# Patient Record
Sex: Female | Born: 1985 | Race: White | Hispanic: No | Marital: Single | Smoking: Current every day smoker
Health system: Southern US, Community
[De-identification: ages and names within clinical notes are randomized; demographics above are authoritative.]

## PROBLEM LIST (undated history)

## (undated) DIAGNOSIS — I10 Essential (primary) hypertension: Secondary | ICD-10-CM

## (undated) DIAGNOSIS — J45909 Unspecified asthma, uncomplicated: Secondary | ICD-10-CM

## (undated) DIAGNOSIS — M1990 Unspecified osteoarthritis, unspecified site: Secondary | ICD-10-CM

## (undated) DIAGNOSIS — E119 Type 2 diabetes mellitus without complications: Secondary | ICD-10-CM

## (undated) DIAGNOSIS — M5126 Other intervertebral disc displacement, lumbar region: Secondary | ICD-10-CM

## (undated) HISTORY — PX: BACK SURGERY: SHX140

---
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
## 2006-02-08 ENCOUNTER — Emergency Department: Payer: Self-pay | Admitting: Emergency Medicine

## 2006-02-08 ENCOUNTER — Other Ambulatory Visit: Payer: Self-pay

## 2006-10-03 ENCOUNTER — Emergency Department: Payer: Self-pay | Admitting: Emergency Medicine

## 2006-12-13 ENCOUNTER — Ambulatory Visit: Payer: Self-pay | Admitting: Internal Medicine

## 2007-01-15 ENCOUNTER — Emergency Department: Payer: Self-pay | Admitting: Emergency Medicine

## 2007-01-24 ENCOUNTER — Emergency Department: Payer: Self-pay | Admitting: Emergency Medicine

## 2007-04-28 ENCOUNTER — Emergency Department: Payer: Self-pay | Admitting: Emergency Medicine

## 2007-11-10 ENCOUNTER — Emergency Department: Payer: Self-pay | Admitting: Emergency Medicine

## 2007-11-25 ENCOUNTER — Emergency Department: Payer: Self-pay | Admitting: Emergency Medicine

## 2007-11-25 ENCOUNTER — Other Ambulatory Visit: Payer: Self-pay

## 2009-03-08 ENCOUNTER — Emergency Department: Payer: Self-pay | Admitting: Emergency Medicine

## 2012-10-24 ENCOUNTER — Emergency Department: Payer: Self-pay | Admitting: Emergency Medicine

## 2013-03-21 ENCOUNTER — Inpatient Hospital Stay: Payer: Self-pay | Admitting: Internal Medicine

## 2013-03-21 LAB — BASIC METABOLIC PANEL
Anion Gap: 6 — ABNORMAL LOW (ref 7–16)
BUN: 13 mg/dL (ref 7–18)
Creatinine: 0.78 mg/dL (ref 0.60–1.30)
EGFR (African American): >60
EGFR (Non-African Amer.): >60
Glucose: 145 mg/dL — ABNORMAL HIGH (ref 65–99)
Potassium: 3.1 mmol/L — ABNORMAL LOW (ref 3.5–5.1)
Sodium: 138 mmol/L (ref 136–145)

## 2013-03-21 LAB — CBC WITH DIFFERENTIAL/PLATELET
Eosinophil #: 0.1 10*3/uL (ref 0.0–0.7)
Eosinophil %: 0.9 %
HCT: 38.8 % (ref 35.0–47.0)
Lymphocyte #: 4.2 10*3/uL — ABNORMAL HIGH (ref 1.0–3.6)
MCH: 29.4 pg (ref 26.0–34.0)
MCHC: 33.5 g/dL (ref 32.0–36.0)
MCV: 88 fL (ref 80–100)
Monocyte #: 0.5 x10 3/mm (ref 0.2–0.9)
Monocyte %: 4.4 %
Neutrophil #: 7.1 10*3/uL — ABNORMAL HIGH (ref 1.4–6.5)
Neutrophil %: 59.1 %
WBC: 11.9 10*3/uL — ABNORMAL HIGH (ref 3.6–11.0)

## 2013-03-22 LAB — CBC WITH DIFFERENTIAL/PLATELET
Basophil #: 0 10*3/uL (ref 0.0–0.1)
MCH: 29.3 pg (ref 26.0–34.0)
Monocyte #: 0.1 x10 3/mm — ABNORMAL LOW (ref 0.2–0.9)
Monocyte %: 0.8 %
Neutrophil #: 8.1 10*3/uL — ABNORMAL HIGH (ref 1.4–6.5)
Neutrophil %: 87.2 %
Platelet: 248 10*3/uL (ref 150–440)
RDW: 15 % — ABNORMAL HIGH (ref 11.5–14.5)

## 2013-03-22 LAB — COMPREHENSIVE METABOLIC PANEL
Albumin: 3.3 g/dL — ABNORMAL LOW (ref 3.4–5.0)
BUN: 14 mg/dL (ref 7–18)
Calcium, Total: 9 mg/dL (ref 8.5–10.1)
Chloride: 106 mmol/L (ref 98–107)
Co2: 25 mmol/L (ref 21–32)
Creatinine: 0.72 mg/dL (ref 0.60–1.30)
Glucose: 129 mg/dL — ABNORMAL HIGH (ref 65–99)
Potassium: 3.9 mmol/L (ref 3.5–5.1)
SGOT(AST): 13 U/L — ABNORMAL LOW (ref 15–37)
Total Protein: 7.1 g/dL (ref 6.4–8.2)

## 2013-03-23 LAB — RAPID HIV-1/2 QL/CONFIRM: HIV-1/2,Rapid Ql: NEGATIVE

## 2013-03-23 LAB — URINALYSIS, COMPLETE
Bacteria: NONE SEEN
Bilirubin,UR: NEGATIVE
Ph: 6 (ref 4.5–8.0)
Protein: NEGATIVE
RBC,UR: <1 /HPF (ref 0–5)
Squamous Epithelial: 5
WBC UR: <1 /HPF (ref 0–5)

## 2013-03-23 LAB — GC/CHLAMYDIA PROBE AMP

## 2013-03-24 LAB — EXPECTORATED SPUTUM ASSESSMENT W GRAM STAIN, RFLX TO RESP C

## 2013-03-25 LAB — CBC WITH DIFFERENTIAL/PLATELET
Basophil #: 0.1 10*3/uL (ref 0.0–0.1)
Eosinophil %: 0.1 %
HGB: 13.9 g/dL (ref 12.0–16.0)
Lymphocyte #: 4 10*3/uL — ABNORMAL HIGH (ref 1.0–3.6)
MCHC: 34.1 g/dL (ref 32.0–36.0)
Monocyte %: 6.6 %
Neutrophil #: 10.6 10*3/uL — ABNORMAL HIGH (ref 1.4–6.5)
Neutrophil %: 67.3 %
Platelet: 274 10*3/uL (ref 150–440)
RDW: 14.9 % — ABNORMAL HIGH (ref 11.5–14.5)
WBC: 15.8 10*3/uL — ABNORMAL HIGH (ref 3.6–11.0)

## 2013-03-25 LAB — BASIC METABOLIC PANEL
Anion Gap: 7 (ref 7–16)
BUN: 23 mg/dL — ABNORMAL HIGH (ref 7–18)
Co2: 27 mmol/L (ref 21–32)
Creatinine: 0.72 mg/dL (ref 0.60–1.30)
EGFR (African American): >60
Potassium: 3.7 mmol/L (ref 3.5–5.1)
Sodium: 138 mmol/L (ref 136–145)

## 2013-11-24 DIAGNOSIS — Z9109 Other allergy status, other than to drugs and biological substances: Secondary | ICD-10-CM | POA: Insufficient documentation

## 2013-11-24 DIAGNOSIS — J45909 Unspecified asthma, uncomplicated: Secondary | ICD-10-CM | POA: Insufficient documentation

## 2013-11-24 DIAGNOSIS — G43909 Migraine, unspecified, not intractable, without status migrainosus: Secondary | ICD-10-CM | POA: Insufficient documentation

## 2013-11-24 DIAGNOSIS — J4540 Moderate persistent asthma, uncomplicated: Secondary | ICD-10-CM | POA: Insufficient documentation

## 2013-11-24 DIAGNOSIS — K219 Gastro-esophageal reflux disease without esophagitis: Secondary | ICD-10-CM | POA: Insufficient documentation

## 2014-02-23 ENCOUNTER — Emergency Department: Payer: Self-pay | Admitting: Emergency Medicine

## 2014-03-14 ENCOUNTER — Emergency Department: Payer: Self-pay | Admitting: Emergency Medicine

## 2014-03-15 LAB — CBC WITH DIFFERENTIAL/PLATELET
BASOS PCT: 1.2 %
Basophil #: 0.1 10*3/uL (ref 0.0–0.1)
EOS PCT: 1.7 %
Eosinophil #: 0.2 10*3/uL (ref 0.0–0.7)
HCT: 38.4 % (ref 35.0–47.0)
HGB: 13 g/dL (ref 12.0–16.0)
LYMPHS PCT: 34.4 %
Lymphocyte #: 3.1 10*3/uL (ref 1.0–3.6)
MCH: 30.7 pg (ref 26.0–34.0)
MCHC: 33.9 g/dL (ref 32.0–36.0)
MCV: 91 fL (ref 80–100)
Monocyte #: 0.4 x10 3/mm (ref 0.2–0.9)
Monocyte %: 4.5 %
Neutrophil #: 5.2 10*3/uL (ref 1.4–6.5)
Neutrophil %: 58.2 %
Platelet: 269 10*3/uL (ref 150–440)
RBC: 4.24 10*6/uL (ref 3.80–5.20)
RDW: 15.9 % — ABNORMAL HIGH (ref 11.5–14.5)
WBC: 8.9 10*3/uL (ref 3.6–11.0)

## 2014-03-15 LAB — COMPREHENSIVE METABOLIC PANEL
ANION GAP: 8 (ref 7–16)
AST: 12 U/L — AB (ref 15–37)
Albumin: 3.3 g/dL — ABNORMAL LOW (ref 3.4–5.0)
Alkaline Phosphatase: 73 U/L
BILIRUBIN TOTAL: 0.2 mg/dL (ref 0.2–1.0)
BUN: 11 mg/dL (ref 7–18)
CALCIUM: 8.8 mg/dL (ref 8.5–10.1)
CO2: 23 mmol/L (ref 21–32)
CREATININE: 0.94 mg/dL (ref 0.60–1.30)
Chloride: 108 mmol/L — ABNORMAL HIGH (ref 98–107)
EGFR (Non-African Amer.): >60
Glucose: 110 mg/dL — ABNORMAL HIGH (ref 65–99)
Osmolality: 278 (ref 275–301)
Potassium: 3.5 mmol/L (ref 3.5–5.1)
SGPT (ALT): 16 U/L
Sodium: 139 mmol/L (ref 136–145)
Total Protein: 6.5 g/dL (ref 6.4–8.2)

## 2014-03-15 LAB — URINALYSIS, COMPLETE
BILIRUBIN, UR: NEGATIVE
Blood: NEGATIVE
Glucose,UR: NEGATIVE mg/dL (ref 0–75)
Nitrite: NEGATIVE
Ph: 5 (ref 4.5–8.0)
Protein: 30
Specific Gravity: 1.032 (ref 1.003–1.030)
Squamous Epithelial: 20
WBC UR: 75 /HPF (ref 0–5)

## 2014-03-15 LAB — LIPASE, BLOOD: Lipase: 165 U/L (ref 73–393)

## 2014-03-15 LAB — TROPONIN I

## 2014-05-15 ENCOUNTER — Emergency Department: Payer: Self-pay | Admitting: Emergency Medicine

## 2014-05-26 ENCOUNTER — Emergency Department: Payer: Self-pay | Admitting: Student

## 2014-08-12 ENCOUNTER — Emergency Department: Payer: Self-pay | Admitting: Emergency Medicine

## 2014-10-30 NOTE — H&P (Signed)
PATIENT NAME:  Stacey Pope, Stacey Pope MR#:  [id number] DATE OF BIRTH:  July 25, 1985   PRIMARY CARE PHYSICIAN: Dr. Judithann Sheen.   HISTORY OF PRESENT ILLNESS: The patient is a 29 year old Caucasian female with history of asthma, history of tobacco abuse, ongoing, who presents to the hospital with complaints of cough, shortness of breath, as well as wheezing. According to the patient, she was doing well up until approximately Sunday, which is 6 days ago, when she started having dry cough as well as wheezing as well as fevers. Her fever was as high as 101 last night. She was seen at [facility] walk-in  clinic where she had x-ray done which did not show any pneumonia; however, the patient was given Solu-Medrol as well as Xopenex inhalation. She was prescribed Levaquin as well as prednisone. She returned back to [facility]. She was again given DuoNebs; however, since she did not improve, she was sent to the Emergency Room today.   After 3 DuoNebs here in the Emergency Room, she is still wheezing and her peak flow as high as 250. Her O2 saturations are 96% on room air. Hospitalist services were contacted for admission.   PAST MEDICAL HISTORY: Significant for history of asthma. The patient's medical history is also significant for environmental allergies, depression with questionable bipolar symptoms, also gastritis, gastroesophageal reflux, obesity, apnea, migraine headaches, with a negative CT scan in March 2008.   PAST SURGICAL HISTORY: None.   SOCIAL HISTORY: The patient is single and has no children. She smokes approximately 1 cigar a day and she denies alcohol. She does not work.   FAMILY HISTORY: Significant for the patient's father who died of liver cirrhosis. The patient's mother had spinal cord injury. She is on disability and she also has diabetes.   ALLERGIES: AUGMENTIN AS WELL AS ZITHROMAX.   REVIEW OF SYSTEMS: Positive for cough with wheezes and shortness of breath, a dry cough, also fevers and fatigue and  weakness. She admits of losing weight approximately, 65 pounds, in the past year due to walking. She admits of having some chest pains as well as with cough. She has a few episodes of green-looking sputum, and she intermittently feels anxious. She also has nausea and intermittent constipation. Otherwise, denies any other problems with vision like double vision, glaucoma or, cataracts.  ENT:  Denies any tinnitus, allergies, epistaxis, sinus tenderness, difficulty swallowing. Admits of having some sinus congestion.  RESPIRATORY: Denies any hemoptysis. Admits of asthma, admits of painful respirations especially with cough, and she admits of having some black dots in front of her eyes whenever she coughs numerous times but denies any presyncopal episodes. No orthopnea, edema. Admits to arrhythmias as well as palpitations. No syncope.  GASTROINTESTINAL: Denies any vomiting, diarrhea, rectal bleeding, change in bowel habits.  GENITOURINARY: Denies dysuria, hematuria, frequency, incontinence.  ENDOCRINOLOGY: Denies any polydipsia, nocturia, thyroid problems, heat or cold intolerance or thirst.  HEMATOLOGIC: Denies anemia, easy bruising, bleeding or swollen glands.  SKIN: Denies any acne, rashes or change in moles.  MUSCULOSKELETAL: Denies arthritis, cramps, (Dictation Anomaly) .  No numbness, epilepsy or tremor.  PSYCHIATRIC: Denies anxiety, insomnia, depression.   PHYSICAL EXAMINATION:  VITAL SIGNS: On arrival to the hospital, temperature 98.3, pulse 82, respirations 18, blood pressure 132/82. Saturation was 96% on room air.  GENERAL: A well-nourished Caucasian female in no significant distress, comfortable on the stretcher. She is somewhat dusky, pale looking, but otherwise overall comfortable.  HEENT: Pupils are equal, reactive to light. Extraocular movements are intact. No icterus  or conjunctivitis. Has normal hearing. No pharyngeal erythema. Mucosa is moist.  NECK: No masses. Supple. Thyroid is not  enlarged. No adenopathy. No JVD or carotid bruits bilaterally. Full range of motion.  LUNGS: Rhonchi as well as wheezing. Somewhat diminished breath sounds and labored inspirations intermittently whenever she coughs. At that time, when she coughs, she will have increased effort to breathe. In mild respiratory distress. No dullness to percussion.  CARDIOVASCULAR: S1, S2 appreciated. No murmurs, rubs, or gallops. Regular rate and rhythm. PMI not enlarged.  Chest is nontender to palpation. Pedal pulses 1+. No lower extremity edema or calf tenderness or cyanosis was noted.  ABDOMEN: Soft, nontender. Bowel sounds are present. No hepatosplenomegaly or masses were noted.  RECTAL: Deferred.  MUSCLE STRENGTH: Able to move all extremities. No cyanosis, degenerative joint disease or kyphosis. Gait is not tested.  SKIN: Without any rashes, lesions, erythema, nodularity or induration. It was warm and dry to palpation. She has  onychomycosis in her toes.  LYMPHATIC: No adenopathy in the cervical region.  NEUROLOGICAL: Cranial nerves grossly intact. Sensory is intact. No dysarthria or aphasia. The patient is alert and oriented to time, person and place, cooperative. Memory is good.  PSYCHIATRIC: No significant confusion, agitation or depression noted.   DIAGNOSTIC STUDIES: [facility]: No obvious pneumonia.   I cannot see this outpatient x-ray at this point and confirm these findings which were reiterated  to me from by Emergency Room physician.   None of the labs are done at this point.   ASSESSMENT AND PLAN:  1.  Asthma exacerbation. Admitted to medical floor. Start her on steroids IV. Also initiate the patient on albuterol, ipratropium as well as (Dictation Anomaly)inhaled steroids twice a day and follow clinically. We will also initiate her on antibiotic therapy with Levaquin.  2.  Acute bronchitis. Get sputum cultures and continue Levaquin.  3.  Tobacco abuse. Discussed cessation for  approximately 4 minutes and nicotine replacement therapy will be initiated.  4.  History of headaches. Tylenol as needed.   TIME SPENT: 50 minutes on the patient and care.   ____________________________ Katharina Caperima Argie Lober, MD rv:np D: 03/21/2013 20:27:00 ET T: 03/21/2013 22:51:48 ET JOB#: [id number]  cc: Duane LopeJeffrey D. Judithann SheenSparks, MD Katharina Caperima Oluwatobiloba Martin, MD, <Dictator> Rondarius Kadrmas MD ELECTRONICALLY SIGNED 04/29/2013 22:29

## 2014-10-30 NOTE — Discharge Summary (Signed)
PATIENT NAME:  Stacey Pope, Stacey Pope MR#:  [id number] DATE OF BIRTH:  [birth year]  DATE OF ADMISSION:  03/21/2013 DATE OF DISCHARGE:  03/25/2013  REASON FOR ADMISSION: Shortness of breath.   HISTORY OF PRESENT ILLNESS: Please see the dictated HPI done by Dr. Winona LegatoVaickute on 03/21/2013.   PAST MEDICAL HISTORY:  1.  COPD/asthma.  2.  Tobacco abuse.  3.  Environmental allergies. 4.  Anxiety/depression.  5.  Gastritis.  6.  GE reflux disease.  7.  Migraine headaches.  8.  Obesity.   MEDICATIONS ON ADMISSION: Please see admission note.   ALLERGIES: AUGMENTIN AND ZITHROMAX.   SOCIAL HISTORY, FAMILY HISTORY AND REVIEW OF SYSTEMS: As per admission note.   PHYSICAL EXAM:  GENERAL: The patient was in no acute distress.  VITAL SIGNS: Stable and she was afebrile.  HEENT: Unremarkable.  NECK: Supple without JVD.  LUNGS: Revealed scattered wheezes and rhonchi.  CARDIAC: Regular rate and rhythm. Normal S1, S2.  ABDOMEN: Soft and nontender.  EXTREMITIES: Without edema.  NEUROLOGIC: Grossly nonfocal.   HOSPITAL COURSE: The patient was admitted with asthma exacerbation and acute bronchitis. She was started on IV antibiotics with IV steroids with DuoNeb SVNs. The patient improved slowly. Followup chest x-ray was negative for pneumonia. She was weaned to oral steroids and oral antibiotics with continued improvement. She was weaned off oxygen. By 03/25/2013, the patient was stable and ready for discharge.   DISCHARGE DIAGNOSES:  1.  Acute bronchitis.  2.  Asthma exacerbation.  3.  Anxiety/depression.  4.  Tobacco abuse.   DISCHARGE MEDICATIONS:  1.  Albuterol 2 puffs q.i.d. p.r.n. shortness of breath.  2.  Prednisone taper as directed.  3.  Lexapro 10 mg p.o. daily.  4.  Combivent 1 puff q.i.d.  5.  Levaquin 500 mg p.o. daily x 10 days.  6.  Nicoderm 14 mg topically daily.  7.  Robitussin-AC 10 mL p.o. q.4 hours p.r.n. cough.   FOLLOWUP PLANS AND APPOINTMENTS: The patient was discharged on a  regular diet. She will follow up with me in the office in 1 to 2 weeks, sooner if needed.  ____________________________ Duane LopeJeffrey D. Judithann SheenSparks, MD jds:aw D: 04/03/2013 11:54:29 ET T: 04/03/2013 12:39:43 ET JOB#: [id number]  cc: Duane LopeJeffrey D. Judithann SheenSparks, MD, <Dictator> JEFFREY Rodena Medin SPARKS MD ELECTRONICALLY SIGNED 04/03/2013 13:11

## 2015-05-18 ENCOUNTER — Emergency Department: Payer: Self-pay

## 2015-05-18 ENCOUNTER — Encounter: Payer: Self-pay | Admitting: Emergency Medicine

## 2015-05-18 ENCOUNTER — Emergency Department
Admission: EM | Admit: 2015-05-18 | Discharge: 2015-05-18 | Disposition: A | Discharge status: Home / Self Care | Payer: Self-pay | Attending: Emergency Medicine | Admitting: Emergency Medicine

## 2015-05-18 DIAGNOSIS — O99331 Smoking (tobacco) complicating pregnancy, first trimester: Secondary | ICD-10-CM | POA: Insufficient documentation

## 2015-05-18 DIAGNOSIS — R1031 Right lower quadrant pain: Secondary | ICD-10-CM

## 2015-05-18 DIAGNOSIS — R1032 Left lower quadrant pain: Secondary | ICD-10-CM

## 2015-05-18 DIAGNOSIS — Z3A00 Weeks of gestation of pregnancy not specified: Secondary | ICD-10-CM | POA: Insufficient documentation

## 2015-05-18 DIAGNOSIS — O039 Complete or unspecified spontaneous abortion without complication: Secondary | ICD-10-CM | POA: Insufficient documentation

## 2015-05-18 DIAGNOSIS — O209 Hemorrhage in early pregnancy, unspecified: Secondary | ICD-10-CM

## 2015-05-18 DIAGNOSIS — N939 Abnormal uterine and vaginal bleeding, unspecified: Secondary | ICD-10-CM

## 2015-05-18 DIAGNOSIS — F17210 Nicotine dependence, cigarettes, uncomplicated: Secondary | ICD-10-CM | POA: Insufficient documentation

## 2015-05-18 HISTORY — DX: Unspecified asthma, uncomplicated: J45.909

## 2015-05-18 LAB — URINALYSIS COMPLETE WITH MICROSCOPIC (ARMC ONLY)
BACTERIA UA: NONE SEEN
Bilirubin Urine: NEGATIVE
Glucose, UA: NEGATIVE mg/dL
Ketones, ur: NEGATIVE mg/dL
NITRITE: NEGATIVE
PH: 6 (ref 5.0–8.0)
PROTEIN: NEGATIVE mg/dL
SPECIFIC GRAVITY, URINE: 1.019 (ref 1.005–1.030)

## 2015-05-18 LAB — CBC
HEMATOCRIT: 41.9 % (ref 35.0–47.0)
HEMOGLOBIN: 14 g/dL (ref 12.0–16.0)
MCH: 30.4 pg (ref 26.0–34.0)
MCHC: 33.3 g/dL (ref 32.0–36.0)
MCV: 91.3 fL (ref 80.0–100.0)
Platelets: 237 10*3/uL (ref 150–440)
RBC: 4.59 MIL/uL (ref 3.80–5.20)
RDW: 13.9 % (ref 11.5–14.5)
WBC: 10.4 10*3/uL (ref 3.6–11.0)

## 2015-05-18 LAB — COMPREHENSIVE METABOLIC PANEL
ALBUMIN: 4.1 g/dL (ref 3.5–5.0)
ALT: 12 U/L — ABNORMAL LOW (ref 14–54)
AST: 14 U/L — ABNORMAL LOW (ref 15–41)
Alkaline Phosphatase: 68 U/L (ref 38–126)
Anion gap: 5 (ref 5–15)
BUN: 12 mg/dL (ref 6–20)
CO2: 25 mmol/L (ref 22–32)
Calcium: 9.1 mg/dL (ref 8.9–10.3)
Chloride: 110 mmol/L (ref 101–111)
Creatinine, Ser: 0.65 mg/dL (ref 0.44–1.00)
GFR calc Af Amer: >60 mL/min (ref >60)
GFR calc non Af Amer: >60 mL/min (ref >60)
GLUCOSE: 103 mg/dL — AB (ref 65–99)
POTASSIUM: 3.7 mmol/L (ref 3.5–5.1)
SODIUM: 140 mmol/L (ref 135–145)
Total Bilirubin: 0.6 mg/dL (ref 0.3–1.2)
Total Protein: 7.2 g/dL (ref 6.5–8.1)

## 2015-05-18 LAB — LIPASE, BLOOD: LIPASE: 25 U/L (ref 11–51)

## 2015-05-18 LAB — CHLAMYDIA/NGC RT PCR (ARMC ONLY)
Chlamydia Tr: NOT DETECTED
N GONORRHOEAE: NOT DETECTED

## 2015-05-18 LAB — WET PREP, GENITAL
CLUE CELLS WET PREP: NONE SEEN
Trich, Wet Prep: NONE SEEN
WBC WET PREP: NONE SEEN
Yeast Wet Prep HPF POC: NONE SEEN

## 2015-05-18 LAB — ANTIBODY SCREEN: ANTIBODY SCREEN: NEGATIVE

## 2015-05-18 LAB — HCG, QUANTITATIVE, PREGNANCY: HCG, BETA CHAIN, QUANT, S: 4 m[IU]/mL (ref <5)

## 2015-05-18 LAB — ABO/RH: ABO/RH(D): A NEG

## 2015-05-18 MED ORDER — IBUPROFEN 800 MG PO TABS: 800.0000 mg | ORAL_TABLET | Freq: Three times a day (TID) | ORAL | Status: DC | PRN

## 2015-05-18 MED ORDER — OXYCODONE-ACETAMINOPHEN 5-325 MG PO TABS: 1.0000 | ORAL_TABLET | ORAL | Status: AC | PRN

## 2015-05-18 MED ORDER — RHO D IMMUNE GLOBULIN 1500 UNIT/2ML IJ SOSY
300.0000 ug | PREFILLED_SYRINGE | Freq: Once | INTRAMUSCULAR | Status: AC
  Administered 2015-05-18: 300 ug via INTRAMUSCULAR
  Filled 2015-05-18: qty 2

## 2015-05-18 MED ORDER — KETOROLAC TROMETHAMINE 30 MG/ML IJ SOLN
30.0000 mg | Freq: Once | INTRAMUSCULAR | Status: AC
  Administered 2015-05-18: 30 mg via INTRAVENOUS
  Filled 2015-05-18: qty 1

## 2015-05-18 MED ORDER — ONDANSETRON HCL 4 MG/2ML IJ SOLN
4.0000 mg | Freq: Once | INTRAMUSCULAR | Status: AC
  Administered 2015-05-18: 4 mg via INTRAVENOUS
  Filled 2015-05-18: qty 2

## 2015-05-18 MED ORDER — ACETAMINOPHEN 500 MG PO TABS
1000.0000 mg | ORAL_TABLET | Freq: Once | ORAL | Status: AC
  Administered 2015-05-18: 1000 mg via ORAL
  Filled 2015-05-18: qty 2

## 2015-05-18 MED ORDER — SODIUM CHLORIDE 0.9 % IV BOLUS (SEPSIS)
1000.0000 mL | Freq: Once | INTRAVENOUS | Status: AC
  Administered 2015-05-18: 1000 mL via INTRAVENOUS

## 2015-05-18 NOTE — ED Notes (Signed)
Pt to ed with c/o vaginal bleeding today with abd pain and cramping.  Pt reports she had positive pregnancy test on Friday.

## 2015-05-18 NOTE — ED Provider Notes (Addendum)
[facility] Emergency Department Provider Note  ____________________________________________  Time seen: Approximately 3:32 PM  I have reviewed the triage vital signs and the nursing notes.   HISTORY  Chief Complaint Abdominal Pain and Vaginal Bleeding    HPI Stacey Pope is a 29 y.o. female G1 P0 with LMP 9/28 4/16 presenting with acute onset of significant vaginal bleeding and lower abdominal cramping. Patient states that she has been having nausea and vomiting for several weeks and had a positive pregnancy test at home 4 days ago. This morning she woke up with pelvic cramping and bleeding heavier than a period. She states that she passed one large clot. She denies any shortness of breath or lightheadedness. She denies any fever. Recently, she states that her vaginal discharge has been malodorous. She denies any urinary symptoms.   Past Medical History  Diagnosis Date  . Asthma     There are no active problems to display for this patient.   History reviewed. No pertinent past surgical history.  Current Outpatient Rx  Name  Route  Sig  Dispense  Refill  . ibuprofen (ADVIL,MOTRIN) 800 MG tablet   Oral   Take 1 tablet (800 mg total) by mouth every 8 (eight) hours as needed for moderate pain (with food).   20 tablet   0   . oxyCODONE-acetaminophen (ROXICET) 5-325 MG tablet   Oral   Take 1 tablet by mouth every 4 (four) hours as needed for severe pain.   6 tablet   0     Allergies Augmentin  History reviewed. No pertinent family history.  Social History Social History  Substance Use Topics  . Smoking status: Current Every Day Smoker  . Smokeless tobacco: None  . Alcohol Use: No    Review of Systems Constitutional: No fever/chills. No lightheadedness or syncope. Eyes: No visual changes. ENT: No sore throat. Cardiovascular: Denies chest pain, palpitations. Respiratory: Denies shortness of breath.  No cough. Gastrointestinal:  Positive lower abdominal pain.  Positive nausea, positive vomiting.  No diarrhea.  No constipation. Genitourinary: Negative for dysuria. Musculoskeletal: Negative for back pain. Skin: Negative for rash. Neurological: Negative for headaches, focal weakness or numbness.  10-point ROS otherwise negative.  ____________________________________________   PHYSICAL EXAM:  VITAL SIGNS: ED Triage Vitals  Enc Vitals Group     BP 05/18/15 1422 152/98 mmHg     Pulse Rate 05/18/15 1422 85     Resp --      Temp 05/18/15 1422 98.6 F (37 C)     Temp Source 05/18/15 1422 Oral     SpO2 05/18/15 1422 100 %     Weight 05/18/15 1422 210 lb (95.255 kg)     Height 05/18/15 1422  (1.676 m)     Head Cir --      Peak Flow --      Pain Score 05/18/15 1408 8     Pain Loc --      Pain Edu? --      Excl. in GC? --     Constitutional: Alert and oriented. Well appearing and in no acute distress. Answer question appropriately. Tearful Eyes: Conjunctivae are normal.  EOMI. Head: Atraumatic. Nose: No congestion/rhinnorhea. Mouth/Throat: Mucous membranes are moist.  Neck: No stridor.  Supple.   Cardiovascular: Normal rate, regular rhythm. No murmurs, rubs or gallops.  Respiratory: Normal respiratory effort.  No retractions. Lungs CTAB.  No wheezes, rales or ronchi. Gastrointestinal: Obese, soft and nondistended. Minimally tender in the suprapubic region.  No rebound or guarding. No peritoneal signs. Genitourinary: normal appearing external genitalia w/o lesions.  Speculum exam reveals normal appearing cervix w/ minimal blood in the vagina but no significant active extrusion.  No dishcarge. Bimanual exam reveals closed cervix, no CMT, no adnexal masses, diffuse pelvic ttp w/o focality. Musculoskeletal: No LE edema.  Neurologic:  Normal speech and language. No gross focal neurologic deficits are appreciated.  Skin:  Skin is warm, dry and intact. No rash noted. Psychiatric: Mood and affect are normal.  Speech and behavior are normal.  Normal judgement.  ____________________________________________   LABS (all labs ordered are listed, but only abnormal results are displayed)  Labs Reviewed  COMPREHENSIVE METABOLIC PANEL - Abnormal; Notable for the following:    Glucose, Bld 103 (*)    AST 14 (*)    ALT 12 (*)    All other components within normal limits  URINALYSIS COMPLETEWITH MICROSCOPIC ([facility] ONLY) - Abnormal; Notable for the following:    Color, Urine YELLOW (*)    APPearance HAZY (*)    Hgb urine dipstick 3+ (*)    Leukocytes, UA 2+ (*)    Squamous Epithelial / LPF 0-5 (*)    All other components within normal limits  WET PREP, GENITAL  CHLAMYDIA/NGC RT PCR ([facility] ONLY)  LIPASE, BLOOD  CBC  HCG, QUANTITATIVE, PREGNANCY  ABO/RH   ____________________________________________  EKG  Not indicated ____________________________________________  RADIOLOGY  US Transvaginal Non-ob  05/18/2015  CLINICAL DATA:  Vaginal bleeding since this morning, beta HCG equals 4 EXAM: TRANSABDOMINAL AND TRANSVAGINAL ULTRASOUND OF PELVIS TECHNIQUE: Both transabdominal and transvaginal ultrasound examinations of the pelvis were performed. Transabdominal technique was performed for global imaging of the pelvis including uterus, ovaries, adnexal regions, and pelvic cul-de-sac. It was necessary to proceed with endovaginal exam following the transabdominal exam to visualize the endometrium and ovaries. COMPARISON:  None FINDINGS: Uterus Measurements: 6.8 x 4.3 x 4.1 cm. No fibroids or other mass visualized. Endometrium Thickness: 5 mm. Mildly hypervascular. No focal abnormality visualized. Right ovary Measurements: 37 x 12 x 21 mm. Normal appearance/no adnexal mass. Left ovary Measurements: 44 x 25 x 34 mm. Normal appearance/no adnexal mass. Other findings Trace free fluid in the pelvis within physiologic limits of normal IMPRESSION: No evidence of intrauterine gestation. Endometrium is not  thickened but is mildly hypervascular, significance uncertain. No focal anatomic abnormality is identified. The patient indicates recent positive pregnancy test and differential diagnostic possibilities include gestation that is too early to image, missed spontaneous abortion, or nonvisualized ectopic pregnancy of which there is no other specific evidence. Clinical and imaging follow-up recommended as necessary. Electronically Signed   By: Esperanza Heir M.D.   On: 05/18/2015 17:57   US Pelvis Complete  05/18/2015  CLINICAL DATA:  Vaginal bleeding since this morning, beta HCG equals 4 EXAM: TRANSABDOMINAL AND TRANSVAGINAL ULTRASOUND OF PELVIS TECHNIQUE: Both transabdominal and transvaginal ultrasound examinations of the pelvis were performed. Transabdominal technique was performed for global imaging of the pelvis including uterus, ovaries, adnexal regions, and pelvic cul-de-sac. It was necessary to proceed with endovaginal exam following the transabdominal exam to visualize the endometrium and ovaries. COMPARISON:  None FINDINGS: Uterus Measurements: 6.8 x 4.3 x 4.1 cm. No fibroids or other mass visualized. Endometrium Thickness: 5 mm. Mildly hypervascular. No focal abnormality visualized. Right ovary Measurements: 37 x 12 x 21 mm. Normal appearance/no adnexal mass. Left ovary Measurements: 44 x 25 x 34 mm. Normal appearance/no adnexal mass. Other findings Trace free fluid in the pelvis within physiologic  limits of normal IMPRESSION: No evidence of intrauterine gestation. Endometrium is not thickened but is mildly hypervascular, significance uncertain. No focal anatomic abnormality is identified. The patient indicates recent positive pregnancy test and differential diagnostic possibilities include gestation that is too early to image, missed spontaneous abortion, or nonvisualized ectopic pregnancy of which there is no other specific evidence. Clinical and imaging follow-up recommended as necessary.  Electronically Signed   By: Esperanza Heiraymond  Rubner M.D.   On: 05/18/2015 17:57    ____________________________________________   PROCEDURES  Procedure(s) performed: None  Critical Care performed: No ____________________________________________   INITIAL IMPRESSION / ASSESSMENT AND PLAN / ED COURSE  Pertinent labs & imaging results that were available during my care of the patient were reviewed by me and considered in my medical decision making (see chart for details).  29 y.o. G1 P0 with a positive pregnancy test at home presenting with acute onset of vaginal bleeding and suprapubic cramping. The patient has been treated for gonorrhea and chlamydia in the past, which is a risk factor for possible ectopic. I would also consider for shortness of bleeding and threatened AB. The patient is not having symptoms that would be concerning for significant anemia or hemorrhage. I will do a pelvic exam and send pelvic cultures. We'll get a urine test. If the pregnancy test is positive here we will also get a ultrasound.  ----------------------------------------- 5:14 PM on 05/18/2015 -----------------------------------------  The patient hCG is 4. Given her clinical history, the most likely explanation is that she was pregnant and is now having a miscarriage. However I will continue to send her for ultrasound to make sure she does not have any evidence of ectopic pregnancy. Her blood type is come back and is Rh- so I will treat her with program. Her wet prep is back and does not show any evidence of infection.  ----------------------------------------- 6:01 PM on 05/18/2015 -----------------------------------------  The patient's ultrasound shows no intrauterine pregnancy. She does not have any other acute findings. I will talk to the unassigned OB physician on call for close follow-up. At this time, the patient is hemodynamically stable and ready for  discharge.  ----------------------------------------- 6:44 PM on 05/18/2015 -----------------------------------------  I have talked to Dr. Vergie LivingPickens, the physician who is on-call for unassigned OB. He feels that this clinical picture is most consistent with a completed abortion. He recommends outpatient follow-up as needed. I will plan to discharge the patient and have given her return precautions as well as follow-up instructions. ____________________________________________  FINAL CLINICAL IMPRESSION(S) / ED DIAGNOSES  Final diagnoses:  Vaginal bleeding  Vaginal bleeding in pregnancy, first trimester  Threatened abortion in early pregnancy  Bilateral lower abdominal cramping      NEW MEDICATIONS STARTED DURING THIS VISIT:  New Prescriptions   IBUPROFEN (ADVIL,MOTRIN) 800 MG TABLET    Take 1 tablet (800 mg total) by mouth every 8 (eight) hours as needed for moderate pain (with food).   OXYCODONE-ACETAMINOPHEN (ROXICET) 5-325 MG TABLET    Take 1 tablet by mouth every 4 (four) hours as needed for severe pain.     Rockne MenghiniAnne-Caroline Adali Pennings, MD 05/18/15 1804  Rockne MenghiniAnne-Caroline Imad Shostak, MD 05/18/15 [id number]

## 2015-05-18 NOTE — Discharge Instructions (Signed)
Please return to the emergency department if he developed increased bleeding (soaking through more than one pad every 2 hours), lightheadedness, shortness of breath, fever, severe pain, or any other symptoms concerning to you.

## 2015-05-19 LAB — RHOGAM INJECTION: Unit division: 0

## 2015-05-25 ENCOUNTER — Encounter: Payer: Self-pay | Admitting: *Deleted

## 2015-05-25 ENCOUNTER — Emergency Department
Admission: EM | Admit: 2015-05-25 | Discharge: 2015-05-25 | Disposition: A | Discharge status: Home / Self Care | Payer: Self-pay | Attending: Emergency Medicine | Admitting: Emergency Medicine

## 2015-05-25 ENCOUNTER — Emergency Department: Payer: Self-pay

## 2015-05-25 DIAGNOSIS — Z3202 Encounter for pregnancy test, result negative: Secondary | ICD-10-CM | POA: Insufficient documentation

## 2015-05-25 DIAGNOSIS — R1030 Lower abdominal pain, unspecified: Secondary | ICD-10-CM | POA: Insufficient documentation

## 2015-05-25 DIAGNOSIS — F17200 Nicotine dependence, unspecified, uncomplicated: Secondary | ICD-10-CM | POA: Insufficient documentation

## 2015-05-25 DIAGNOSIS — N760 Acute vaginitis: Secondary | ICD-10-CM | POA: Insufficient documentation

## 2015-05-25 DIAGNOSIS — B9689 Other specified bacterial agents as the cause of diseases classified elsewhere: Secondary | ICD-10-CM

## 2015-05-25 LAB — URINALYSIS COMPLETE WITH MICROSCOPIC (ARMC ONLY)
BILIRUBIN URINE: NEGATIVE
Bacteria, UA: NONE SEEN
Glucose, UA: NEGATIVE mg/dL
HGB URINE DIPSTICK: NEGATIVE
KETONES UR: NEGATIVE mg/dL
Nitrite: NEGATIVE
PH: 6 (ref 5.0–8.0)
Protein, ur: NEGATIVE mg/dL
Specific Gravity, Urine: 1.016 (ref 1.005–1.030)

## 2015-05-25 LAB — COMPREHENSIVE METABOLIC PANEL
ALT: 15 U/L (ref 14–54)
AST: 15 U/L (ref 15–41)
Albumin: 3.7 g/dL (ref 3.5–5.0)
Alkaline Phosphatase: 61 U/L (ref 38–126)
Anion gap: 7 (ref 5–15)
BUN: 13 mg/dL (ref 6–20)
CHLORIDE: 107 mmol/L (ref 101–111)
CO2: 26 mmol/L (ref 22–32)
CREATININE: 0.48 mg/dL (ref 0.44–1.00)
Calcium: 9.3 mg/dL (ref 8.9–10.3)
GFR calc non Af Amer: >60 mL/min (ref >60)
Glucose, Bld: 95 mg/dL (ref 65–99)
POTASSIUM: 3.9 mmol/L (ref 3.5–5.1)
SODIUM: 140 mmol/L (ref 135–145)
Total Bilirubin: 0.5 mg/dL (ref 0.3–1.2)
Total Protein: 6.6 g/dL (ref 6.5–8.1)

## 2015-05-25 LAB — WET PREP, GENITAL
CLUE CELLS WET PREP: POSITIVE — AB
SPERM: NONE SEEN
TRICH WET PREP: NONE SEEN
YEAST WET PREP: NONE SEEN

## 2015-05-25 LAB — CBC WITH DIFFERENTIAL/PLATELET
Basophils Absolute: 0.1 10*3/uL (ref 0–0.1)
Basophils Relative: 1 %
EOS ABS: 0.1 10*3/uL (ref 0–0.7)
Eosinophils Relative: 1 %
HEMATOCRIT: 40.3 % (ref 35.0–47.0)
Hemoglobin: 13.3 g/dL (ref 12.0–16.0)
LYMPHS ABS: 3.1 10*3/uL (ref 1.0–3.6)
LYMPHS PCT: 27 %
MCH: 30.1 pg (ref 26.0–34.0)
MCHC: 33 g/dL (ref 32.0–36.0)
MCV: 91.3 fL (ref 80.0–100.0)
MONOS PCT: 5 %
Monocytes Absolute: 0.6 10*3/uL (ref 0.2–0.9)
NEUTROS ABS: 7.5 10*3/uL — AB (ref 1.4–6.5)
NEUTROS PCT: 66 %
Platelets: 271 10*3/uL (ref 150–440)
RBC: 4.42 MIL/uL (ref 3.80–5.20)
RDW: 13.7 % (ref 11.5–14.5)
WBC: 11.3 10*3/uL — ABNORMAL HIGH (ref 3.6–11.0)

## 2015-05-25 LAB — CHLAMYDIA/NGC RT PCR (ARMC ONLY)
Chlamydia Tr: NOT DETECTED
N gonorrhoeae: NOT DETECTED

## 2015-05-25 LAB — POCT PREGNANCY, URINE: Preg Test, Ur: NEGATIVE

## 2015-05-25 MED ORDER — IOHEXOL 240 MG/ML SOLN
25.0000 mL | Freq: Once | INTRAMUSCULAR | Status: AC | PRN
  Administered 2015-05-25: 25 mL via ORAL
  Filled 2015-05-25: qty 25

## 2015-05-25 MED ORDER — IOHEXOL 350 MG/ML SOLN
100.0000 mL | Freq: Once | INTRAVENOUS | Status: AC | PRN
  Administered 2015-05-25: 100 mL via INTRAVENOUS
  Filled 2015-05-25: qty 100

## 2015-05-25 MED ORDER — METRONIDAZOLE 500 MG PO TABS: 500.0000 mg | ORAL_TABLET | Freq: Two times a day (BID) | ORAL | Status: DC

## 2015-05-25 NOTE — Discharge Instructions (Signed)
No certain cause was found for your lower abdominal pain, however your exam and evaluation are reassuring. I suspect you may be having cramping left over from the miscarriage, or you may be having intestinal cramping due to irritable bowel. He recommended follow up with her OB/GYN as well as [facility] or your primary care physician.  Return to the emergency room for any worsening condition including fever, black or bloody stools, vomiting, or worsening abdominal pain.   Abdominal Pain, Adult Many things can cause abdominal pain. Usually, abdominal pain is not caused by a disease and will improve without treatment. It can often be observed and treated at home. Your health care provider will do a physical exam and possibly order blood tests and X-rays to help determine the seriousness of your pain. However, in many cases, more time must pass before a clear cause of the pain can be found. Before that point, your health care provider may not know if you need more testing or further treatment. HOME CARE INSTRUCTIONS Monitor your abdominal pain for any changes. The following actions may help to alleviate any discomfort you are experiencing:  Only take over-the-counter or prescription medicines as directed by your health care provider.  Do not take laxatives unless directed to do so by your health care provider.  Try a clear liquid diet (broth, tea, or water) as directed by your health care provider. Slowly move to a bland diet as tolerated. SEEK MEDICAL CARE IF:  You have unexplained abdominal pain.  You have abdominal pain associated with nausea or diarrhea.  You have pain when you urinate or have a bowel movement.  You experience abdominal pain that wakes you in the night.  You have abdominal pain that is worsened or improved by eating food.  You have abdominal pain that is worsened with eating fatty foods.  You have a fever. SEEK IMMEDIATE MEDICAL CARE IF:  Your pain does not go  away within 2 hours.  You keep throwing up (vomiting).  Your pain is felt only in portions of the abdomen, such as the right side or the left lower portion of the abdomen.  You pass bloody or black tarry stools. MAKE SURE YOU:  Understand these instructions.  Will watch your condition.  Will get help right away if you are not doing well or get worse.   This information is not intended to replace advice given to you by your health care provider. Make sure you discuss any questions you have with your health care provider.   Document Released: 04/05/2005 Document Revised: 03/17/2015 Document Reviewed: 03/05/2013 Elsevier Interactive Patient Education 2016 Elsevier Inc.  Bacterial Vaginosis Bacterial vaginosis is a vaginal infection that occurs when the normal balance of bacteria in the vagina is disrupted. It results from an overgrowth of certain bacteria. This is the most common vaginal infection in women of childbearing age. Treatment is important to prevent complications, especially in pregnant women, as it can cause a premature delivery. CAUSES  Bacterial vaginosis is caused by an increase in harmful bacteria that are normally present in smaller amounts in the vagina. Several different kinds of bacteria can cause bacterial vaginosis. However, the reason that the condition develops is not fully understood. RISK FACTORS Certain activities or behaviors can put you at an increased risk of developing bacterial vaginosis, including:  Having a new sex partner or multiple sex partners.  Douching.  Using an intrauterine device (IUD) for contraception. Women do not get bacterial vaginosis from toilet seats, bedding,  swimming pools, or contact with objects around them. SIGNS AND SYMPTOMS  Some women with bacterial vaginosis have no signs or symptoms. Common symptoms include:  Grey vaginal discharge.  A fishlike odor with discharge, especially after sexual intercourse.  Itching or  burning of the vagina and vulva.  Burning or pain with urination. DIAGNOSIS  Your health care provider will take a medical history and examine the vagina for signs of bacterial vaginosis. A sample of vaginal fluid may be taken. Your health care provider will look at this sample under a microscope to check for bacteria and abnormal cells. A vaginal pH test may also be done.  TREATMENT  Bacterial vaginosis may be treated with antibiotic medicines. These may be given in the form of a pill or a vaginal cream. A second round of antibiotics may be prescribed if the condition comes back after treatment. Because bacterial vaginosis increases your risk for sexually transmitted diseases, getting treated can help reduce your risk for chlamydia, gonorrhea, HIV, and herpes. HOME CARE INSTRUCTIONS   Only take over-the-counter or prescription medicines as directed by your health care provider.  If antibiotic medicine was prescribed, take it as directed. Make sure you finish it even if you start to feel better.  Tell all sexual partners that you have a vaginal infection. They should see their health care provider and be treated if they have problems, such as a mild rash or itching.  During treatment, it is important that you follow these instructions:  Avoid sexual activity or use condoms correctly.  Do not douche.  Avoid alcohol as directed by your health care provider.  Avoid breastfeeding as directed by your health care provider. SEEK MEDICAL CARE IF:   Your symptoms are not improving after 3 days of treatment.  You have increased discharge or pain.  You have a fever. MAKE SURE YOU:   Understand these instructions.  Will watch your condition.  Will get help right away if you are not doing well or get worse. FOR MORE INFORMATION  Centers for Disease Control and Prevention, Division of STD Prevention: SolutionApps.co.zawww.cdc.gov/std American Sexual Health Association (ASHA): www.ashastd.org    This  information is not intended to replace advice given to you by your health care provider. Make sure you discuss any questions you have with your health care provider.   Document Released: 06/26/2005 Document Revised: 07/17/2014 Document Reviewed: 02/05/2013 Elsevier Interactive Patient Education [employer]2016 Elsevier Inc.

## 2015-05-25 NOTE — ED Notes (Signed)
Pt states she was seen here last week and had a miscarriage. Pt states that the bleeding has stopped but she still has abdominal cramping. Pt complains of nausea and weakness. Pt has follow up appt with OB the week after Thanksgiving.

## 2015-05-25 NOTE — ED Notes (Signed)
C/o n/v, abd cramping, stopped vag bleeding, was seen here last week, has not followup

## 2015-05-25 NOTE — ED Provider Notes (Signed)
[facility] Emergency Department Provider Note   ____________________________________________  Time seen: 5:45 PM I have reviewed the triage vital signs and the triage nursing note.  HISTORY  Chief Complaint Abdominal Cramping   Historian Patient  HPI Stacey Pope is a 29 y.o. female who is here for evaluation of lower abdominal cramping, which has continued since treatment for miscarriage. Patient was seen on 05/18/15 and diagnosed with miscarriage. She stopped bleeding yesterday, but she still feels a little bit dizzy and has intermittent lower abdominal cramping. She's had some dysuria. Small amount of vaginal discharge, but no vaginal bleeding.    Past Medical History  Diagnosis Date  . Asthma    G1 P0  There are no active problems to display for this patient.   History reviewed. No pertinent past surgical history.  Current Outpatient Rx  Name  Route  Sig  Dispense  Refill  . ibuprofen (ADVIL,MOTRIN) 800 MG tablet   Oral   Take 1 tablet (800 mg total) by mouth every 8 (eight) hours as needed for moderate pain (with food).   20 tablet   0   . metroNIDAZOLE (FLAGYL) 500 MG tablet   Oral   Take 1 tablet (500 mg total) by mouth 2 (two) times daily.   20 tablet   0   . oxyCODONE-acetaminophen (ROXICET) 5-325 MG tablet   Oral   Take 1 tablet by mouth every 4 (four) hours as needed for severe pain.   6 tablet   0     Allergies Augmentin  No family history on file.  Social History Social History  Substance Use Topics  . Smoking status: Current Every Day Smoker  . Smokeless tobacco: None  . Alcohol Use: No    Review of Systems  Constitutional: Negative for fever. Eyes: Negative for visual changes. ENT: Negative for sore throat. Cardiovascular: Negative for chest pain. Respiratory: Negative for shortness of breath. Gastrointestinal: Negative for vomiting and diarrhea. Genitourinary: Negative for dysuria. Musculoskeletal:  Negative for back pain. Skin: Negative for rash. Neurological: Negative for headache. 10 point Review of Systems otherwise negative ____________________________________________   PHYSICAL EXAM:  VITAL SIGNS: ED Triage Vitals  Enc Vitals Group     BP 05/25/15 1454 123/71 mmHg     Pulse Rate 05/25/15 1454 90     Resp 05/25/15 1454 20     Temp 05/25/15 1454 98.6 F (37 C)     Temp Source 05/25/15 1454 Oral     SpO2 05/25/15 1454 97 %     Weight 05/25/15 1454 210 lb (95.255 kg)     Height 05/25/15 1454  (1.676 m)     Head Cir --      Peak Flow --      Pain Score 05/25/15 1455 7     Pain Loc --      Pain Edu? --      Excl. in GC? --      Constitutional: Alert and oriented. Well appearing and in no distress. Eyes: Conjunctivae are normal. PERRL. Normal extraocular movements. ENT   Head: Normocephalic and atraumatic.   Nose: No congestion/rhinnorhea.   Mouth/Throat: Mucous membranes are moist.   Neck: No stridor. Cardiovascular/Chest: Normal rate, regular rhythm.  No murmurs, rubs, or gallops. Respiratory: Normal respiratory effort without tachypnea nor retractions. Breath sounds are clear and equal bilaterally. No wheezes/rales/rhonchi. Gastrointestinal: Soft. No distention, no guarding, no rebound.  Mild tenderness suprapubically. Genitourinary/rectal: No blood. Mild discharge. Mild tenderness over the suprapubic  area without cervicitis. Musculoskeletal: Nontender with normal range of motion in all extremities. No joint effusions.  No lower extremity tenderness.  No edema. Neurologic:  Normal speech and language. No gross or focal neurologic deficits are appreciated. Skin:  Skin is warm, dry and intact. No rash noted. Psychiatric: Mood and affect are normal. Speech and behavior are normal. Patient exhibits appropriate insight and judgment.  ____________________________________________   EKG I, Governor Rooksebecca Momo Braun, MD, the attending physician have personally  viewed and interpreted all ECGs.  No EKG performed ____________________________________________  LABS (pertinent positives/negatives)  White blood count 11.3 with slight left shift. Hemoglobin 13.3 Wet Prep clue cells positive, few white blood cells Urinalysis 1+ leukocytes and white blood cells, however no bacteria or red blood cells Urine pregnancy test negative Comprehensive metabolic panel without significant abnormality ____________________________________________  RADIOLOGY All Xrays were viewed by me. Imaging interpreted by Radiologist.  [place] pelvic:  IMPRESSION: 1. No retained products of conception. Normal sonographic appearance of the uterus, ovaries, and endometrium. 2. Trace amount of free pelvic fluid adjacent to the right ovary.  CT abdomen and pelvis with contrast:  IMPRESSION: No acute abnormality. No evidence of bowel obstruction or acute bowel inflammation. Normal appendix. No hydronephrosis. __________________________________________  PROCEDURES  Procedure(s) performed: None  Critical Care performed: None  ____________________________________________   ED COURSE / ASSESSMENT AND PLAN  CONSULTATIONS: None  Pertinent labs & imaging results that were available during my care of the patient were reviewed by me and considered in my medical decision making (see chart for details).   Initially I was concerned about possibility of retained products, and obtained an ultrasound. Ultrasound showed no retained products.  When I reexamined the patient's abdomen, she is having pain across both lower quadrants, however the does include the right lower quadrant, and with the elevated white blood cell count, I discussed watchful waiting versus CT of the abdomen and pelvis to rule out appendicitis and other sources of abdominal pain. Patient chose to proceed with CT.  CT negative for acute source of discomfort. I suspect pain may be due to irritable bowel versus  cramping at the end of miscarriage. In either case, patient is stable for outpatient follow-up.  Pelvic exam shows no cervicitis, and patient's daughter and Chlamydia were negative last week. I will treat for bacteria vaginosis.  Patient / Family / Caregiver informed of clinical course, medical decision-making process, and agree with plan.   I discussed return precautions, follow-up instructions, and discharged instructions with patient and/or family.  ___________________________________________   FINAL CLINICAL IMPRESSION(S) / ED DIAGNOSES   Final diagnoses:  Abdominal pain, lower  Bacterial vaginosis       Governor Rooksebecca Duante Arocho, MD 05/25/15 2024

## 2015-05-25 NOTE — ED Notes (Signed)
Pt back from [facility]

## 2015-10-11 ENCOUNTER — Emergency Department
Admission: EM | Admit: 2015-10-11 | Discharge: 2015-10-11 | Disposition: A | Discharge status: Home / Self Care | Payer: Self-pay | Attending: Emergency Medicine | Admitting: Emergency Medicine

## 2015-10-11 DIAGNOSIS — F17200 Nicotine dependence, unspecified, uncomplicated: Secondary | ICD-10-CM | POA: Insufficient documentation

## 2015-10-11 DIAGNOSIS — R590 Localized enlarged lymph nodes: Secondary | ICD-10-CM | POA: Insufficient documentation

## 2015-10-11 DIAGNOSIS — J45909 Unspecified asthma, uncomplicated: Secondary | ICD-10-CM | POA: Insufficient documentation

## 2015-10-11 MED ORDER — PREDNISONE 20 MG PO TABS
60.0000 mg | ORAL_TABLET | Freq: Once | ORAL | Status: AC
  Administered 2015-10-11: 60 mg via ORAL
  Filled 2015-10-11: qty 3

## 2015-10-11 MED ORDER — METHYLPREDNISOLONE 4 MG PO TBPK: ORAL_TABLET | ORAL | Status: DC

## 2015-10-11 MED ORDER — OXYCODONE-ACETAMINOPHEN 5-325 MG PO TABS
1.0000 | ORAL_TABLET | Freq: Once | ORAL | Status: AC
  Administered 2015-10-11: 1 via ORAL
  Filled 2015-10-11: qty 1

## 2015-10-11 MED ORDER — OXYCODONE-ACETAMINOPHEN 7.5-325 MG PO TABS: 1.0000 | ORAL_TABLET | ORAL | Status: AC | PRN

## 2015-10-11 NOTE — ED Provider Notes (Signed)
[facility] Emergency Department Provider Note  ____________________________________________  Time seen: Approximately 1:02 PM  I have reviewed the triage vital signs and the nursing notes.   HISTORY  Chief Complaint Lymphadenopathy    HPI Stacey Pope is a 30 y.o. female patient complaining of left lateral neck tenderness and swelling. Patient states she's nose increase in swelling of this area in the past couple days. Patient states she has ear discomfort last week but it is not bothering her at this time. He denies any dental pain. Patient states she's taking ibuprofen with no relief. He denies any URI signs or symptoms. Patient denies any dysphagia with this complaint. Patient is rating her pain as a 10 over 10.   Past Medical History  Diagnosis Date  . Asthma     There are no active problems to display for this patient.   History reviewed. No pertinent past surgical history.  Current Outpatient Rx  Name  Route  Sig  Dispense  Refill  . ibuprofen (ADVIL,MOTRIN) 800 MG tablet   Oral   Take 1 tablet (800 mg total) by mouth every 8 (eight) hours as needed for moderate pain (with food).   20 tablet   0   . methylPREDNISolone (MEDROL DOSEPAK) 4 MG TBPK tablet      Take Tapered dose as directed   21 tablet   0   . metroNIDAZOLE (FLAGYL) 500 MG tablet   Oral   Take 1 tablet (500 mg total) by mouth 2 (two) times daily.   20 tablet   0   . oxyCODONE-acetaminophen (PERCOCET) 7.5-325 MG tablet   Oral   Take 1 tablet by mouth every 4 (four) hours as needed for severe pain.   20 tablet   0   . oxyCODONE-acetaminophen (ROXICET) 5-325 MG tablet   Oral   Take 1 tablet by mouth every 4 (four) hours as needed for severe pain.   6 tablet   0     Allergies Augmentin  No family history on file.  Social History Social History  Substance Use Topics  . Smoking status: Current Every Day Smoker  . Smokeless tobacco: None  . Alcohol Use: No     Review of Systems Constitutional: No fever/chills Eyes: No visual changes. ENT: No sore throat. Cardiovascular: Denies chest pain. Respiratory: Denies shortness of breath. Gastrointestinal: No abdominal pain.  No nausea, no vomiting.  No diarrhea.  No constipation. Genitourinary: Negative for dysuria. Musculoskeletal: Negative for back pain. Skin: Negative for rash. Neurological: Negative for headaches, focal weakness or numbness. Hematological/Lymphatic:Swollen lymph nodes left cervical area Allergic/Immunilogical: Augmentin  10-point ROS otherwise negative.  ____________________________________________   PHYSICAL EXAM:  VITAL SIGNS: ED Triage Vitals  Enc Vitals Group     BP 10/11/15 1141 132/68 mmHg     Pulse Rate 10/11/15 1141 53     Resp 10/11/15 1141 16     Temp 10/11/15 1141 98.1 F (36.7 C)     Temp Source 10/11/15 1141 Oral     SpO2 10/11/15 1141 100 %     Weight 10/11/15 1141 230 lb (104.327 kg)     Height 10/11/15 1141  (1.676 m)     Head Cir --      Peak Flow --      Pain Score 10/11/15 1141 10     Pain Loc --      Pain Edu? --      Excl. in GC? --     Constitutional:  Alert and oriented. Well appearing and in no acute distress. Eyes: Conjunctivae are normal. PERRL. EOMI. Head: Atraumatic. Nose: No congestion/rhinnorhea. Mouth/Throat: Mucous membranes are moist.  Oropharynx erythematous. Nonexudative tonsils. No erythema edema to the gingiva and no devitalized teeth. Neck: No stridor. No cervical spine tenderness to palpation. Hematological/Lymphatic/Immunilogical: Left cervical lymphadenopathy. Cardiovascular: Normal rate, regular rhythm. Grossly normal heart sounds.  Good peripheral circulation. Respiratory: Normal respiratory effort.  No retractions. Lungs CTAB. Gastrointestinal: Soft and nontender. No distention. No abdominal bruits. No CVA tenderness. Musculoskeletal: No lower extremity tenderness nor edema.  No joint  effusions. Neurologic:  Normal speech and language. No gross focal neurologic deficits are appreciated. No gait instability. Skin:  Skin is warm, dry and intact. No rash noted. Psychiatric: Mood and affect are normal. Speech and behavior are normal.  ____________________________________________   LABS (all labs ordered are listed, but only abnormal results are displayed)  Labs Reviewed - No data to display ____________________________________________  EKG   ____________________________________________  RADIOLOGY   ____________________________________________   PROCEDURES  Procedure(s) performed: None  Critical Care performed: No  ____________________________________________   INITIAL IMPRESSION / ASSESSMENT AND PLAN / ED COURSE  Pertinent labs & imaging results that were available during my care of the patient were reviewed by me and considered in my medical decision making (see chart for details).  Cervical adenopathy left nuchal area. Patient given discharge care instructions. Patient prescription for Percocet and Medrol Dosepak. Ad[facility] if no improvement. Return to ER if condition worsens. ____________________________________________   FINAL CLINICAL IMPRESSION(S) / ED DIAGNOSES  Final diagnoses:  Cervical adenopathy      Joni ReiningRonald K Camiyah Friberg, PA-C 10/11/15 1314  Emily FilbertJonathan E Williams, MD 10/12/15 [phone]

## 2015-10-11 NOTE — ED Notes (Signed)
Pt arrives to ER via POV c/o left sided neck tenderness/swelling around area of lymph nodes. Pt noticed when she awakened this morning. Pt alert and oriented X4, active, cooperative, pt in NAD. RR even and unlabored, color WNL.  Pt states she has taken 6 ibuprofen today without relief.

## 2015-10-11 NOTE — Discharge Instructions (Signed)

## 2015-10-11 NOTE — ED Notes (Signed)
See triage note.  Swelling noted to left side of neck.no diff swallowing or breathing

## 2015-10-19 ENCOUNTER — Emergency Department
Admission: EM | Admit: 2015-10-19 | Discharge: 2015-10-19 | Disposition: A | Discharge status: Home / Self Care | Payer: Self-pay | Attending: Emergency Medicine | Admitting: Emergency Medicine

## 2015-10-19 ENCOUNTER — Encounter: Payer: Self-pay | Admitting: Emergency Medicine

## 2015-10-19 ENCOUNTER — Emergency Department: Payer: Self-pay

## 2015-10-19 DIAGNOSIS — J9801 Acute bronchospasm: Secondary | ICD-10-CM | POA: Insufficient documentation

## 2015-10-19 DIAGNOSIS — Z79899 Other long term (current) drug therapy: Secondary | ICD-10-CM | POA: Insufficient documentation

## 2015-10-19 DIAGNOSIS — R05 Cough: Secondary | ICD-10-CM | POA: Insufficient documentation

## 2015-10-19 DIAGNOSIS — R093 Abnormal sputum: Secondary | ICD-10-CM | POA: Insufficient documentation

## 2015-10-19 DIAGNOSIS — F17200 Nicotine dependence, unspecified, uncomplicated: Secondary | ICD-10-CM | POA: Insufficient documentation

## 2015-10-19 MED ORDER — BENZONATATE 100 MG PO CAPS
200.0000 mg | ORAL_CAPSULE | Freq: Once | ORAL | Status: AC
  Administered 2015-10-19: 200 mg via ORAL
  Filled 2015-10-19: qty 2

## 2015-10-19 MED ORDER — HYDROCOD POLST-CPM POLST ER 10-8 MG/5ML PO SUER: 5.0000 mL | Freq: Two times a day (BID) | ORAL | Status: DC

## 2015-10-19 NOTE — ED Notes (Signed)
Developed congestion and cough last weds..possible fever/chills..  Was seen last week for neck pain and still having pain

## 2015-10-19 NOTE — ED Provider Notes (Signed)
[facility] Emergency Department Provider Note  ____________________________________________  Time seen: Approximately 11:11 AM  I have reviewed the triage vital signs and the nursing notes.   HISTORY  Chief Complaint Neck Pain    HPI Stacey Pope is a 30 y.o. female patient complaining of chest congestion and productive cough for 6 days. Patient was seen on 10/12/2015 for neck pain. Patient states status postMedrol Dosepak and Percocet the adenopathy in her neck improved. Patient stated neck pain return with onset of coughing. Patient denies any fever or chills associated this complaint. Patient denies any nausea vomiting or diarrhea. Patient rates her pain discomfort as an 8/10. No palliative measures taken for new complaint.   Past Medical History  Diagnosis Date  . Asthma     There are no active problems to display for this patient.   History reviewed. No pertinent past surgical history.  Current Outpatient Rx  Name  Route  Sig  Dispense  Refill  . chlorpheniramine-HYDROcodone (TUSSIONEX PENNKINETIC ER) 10-8 MG/5ML SUER   Oral   Take 5 mLs by mouth 2 (two) times daily.   115 mL   0   . ibuprofen (ADVIL,MOTRIN) 800 MG tablet   Oral   Take 1 tablet (800 mg total) by mouth every 8 (eight) hours as needed for moderate pain (with food).   20 tablet   0   . methylPREDNISolone (MEDROL DOSEPAK) 4 MG TBPK tablet      Take Tapered dose as directed   21 tablet   0   . metroNIDAZOLE (FLAGYL) 500 MG tablet   Oral   Take 1 tablet (500 mg total) by mouth 2 (two) times daily.   20 tablet   0   . oxyCODONE-acetaminophen (PERCOCET) 7.5-325 MG tablet   Oral   Take 1 tablet by mouth every 4 (four) hours as needed for severe pain.   20 tablet   0   . oxyCODONE-acetaminophen (ROXICET) 5-325 MG tablet   Oral   Take 1 tablet by mouth every 4 (four) hours as needed for severe pain.   6 tablet   0     Allergies Augmentin  No family history  on file.  Social History Social History  Substance Use Topics  . Smoking status: Current Every Day Smoker  . Smokeless tobacco: None  . Alcohol Use: No    Review of Systems Constitutional: No fever/chills Eyes: No visual changes. ENT: No sore throat. Cardiovascular: Denies chest pain. Respiratory: Denies shortness of breath. Gastrointestinal: No abdominal pain.  No nausea, no vomiting.  No diarrhea.  No constipation. Genitourinary: Negative for dysuria. Musculoskeletal: Negative for back pain. Skin: Negative for rash. Neurological: Negative for headaches, focal weakness or numbness. 10-point ROS otherwise negative.  ____________________________________________   PHYSICAL EXAM:  VITAL SIGNS: ED Triage Vitals  Enc Vitals Group     BP --      Pulse --      Resp --      Temp --      Temp src --      SpO2 --      Weight --      Height --      Head Cir --      Peak Flow --      Pain Score 10/19/15 1110 8     Pain Loc --      Pain Edu? --      Excl. in GC? --     Constitutional: Alert and oriented.  Well appearing and in no acute distress. Eyes: Conjunctivae are normal. PERRL. EOMI. Head: Atraumatic. Nose: No congestion/rhinnorhea. Mouth/Throat: Mucous membranes are moist.  Oropharynx non-erythematous. Neck: No stridor.  No cervical spine tenderness to palpation. Hematological/Lymphatic/Immunilogical: No cervical lymphadenopathy. Cardiovascular: Normal rate, regular rhythm. Grossly normal heart sounds.  Good peripheral circulation. Respiratory: Normal respiratory effort.  No retractions. LungsBilateral Rales and productive cough. Gastrointestinal: Soft and nontender. No distention. No abdominal bruits. No CVA tenderness. Musculoskeletal: No lower extremity tenderness nor edema.  No joint effusions. Neurologic:  Normal speech and language. No gross focal neurologic deficits are appreciated. No gait instability. Skin:  Skin is warm, dry and intact. No rash  noted. Psychiatric: Mood and affect are normal. Speech and behavior are normal.  ____________________________________________   LABS (all labs ordered are listed, but only abnormal results are displayed)  Labs Reviewed - No data to display ____________________________________________  EKG   ____________________________________________  RADIOLOGY   ____________________________________________   PROCEDURES  Procedure(s) performed: None  Critical Care performed: No  ____________________________________________   INITIAL IMPRESSION / ASSESSMENT AND PLAN / ED COURSE  Pertinent labs & imaging results that were available during my care of the patient were reviewed by me and considered in my medical decision making (see chart for details).  Cough due to bronchospasm. Patient advised to use inhaler as directed. Patient given prescription for Tussionex. She [facility]. ____________________________________________   FINAL CLINICAL IMPRESSION(S) / ED DIAGNOSES  Final diagnoses:  Cough due to bronchospasm      Joni ReiningRonald K Leighton Luster, PA-C 10/19/15 1224  Arnaldo NatalPaul F Malinda, MD 10/19/15 [phone]

## 2015-10-19 NOTE — ED Notes (Signed)
States she was seen last week for some neck pain/stiffness   Now having congestion and cough with slight fever.Stacey Pope.also states she is still having some discomfort in neck

## 2015-10-19 NOTE — Discharge Instructions (Signed)
Bronchospasm, Adult  A bronchospasm is a spasm or tightening of the airways going into the lungs. During a bronchospasm breathing becomes more difficult because the airways get smaller. When this happens there can be coughing, a whistling sound when breathing (wheezing), and difficulty breathing. Bronchospasm is often associated with asthma, but not all patients who experience a bronchospasm have asthma.  CAUSES   A bronchospasm is caused by inflammation or irritation of the airways. The inflammation or irritation may be triggered by:   · Allergies (such as to animals, pollen, food, or mold). Allergens that cause bronchospasm may cause wheezing immediately after exposure or many hours later.    · Infection. Viral infections are believed to be the most common cause of bronchospasm.    · Exercise.    · Irritants (such as pollution, cigarette smoke, strong odors, aerosol sprays, and paint fumes).    · Weather changes. Winds increase molds and pollens in the air. Rain refreshes the air by washing irritants out. Cold air may cause inflammation.    · Stress and emotional upset.    SIGNS AND SYMPTOMS   · Wheezing.    · Excessive nighttime coughing.    · Frequent or severe coughing with a simple cold.    · Chest tightness.    · Shortness of breath.    DIAGNOSIS   Bronchospasm is usually diagnosed through a history and physical exam. Tests, such as chest X-rays, are sometimes done to look for other conditions.  TREATMENT   · Inhaled medicines can be given to open up your airways and help you breathe. The medicines can be given using either an inhaler or a nebulizer machine.  · Corticosteroid medicines may be given for severe bronchospasm, usually when it is associated with asthma.  HOME CARE INSTRUCTIONS   · Always have a plan prepared for seeking medical care. Know when to call your health care provider and local emergency services (911 in the [place] Know where you can access local emergency care.  · Only take medicines as  directed by your health care provider.  · If you were prescribed an inhaler or nebulizer machine, ask your health care provider to explain how to use it correctly. Always use a spacer with your inhaler if you were given one.  · It is necessary to remain calm during an attack. Try to relax and breathe more slowly.   · Control your home environment in the following ways:      Change your heating and air conditioning filter at least once a month.      Limit your use of fireplaces and wood stoves.    Do not smoke and do not allow smoking in your home.      Avoid exposure to perfumes and fragrances.      Get rid of pests (such as roaches and mice) and their droppings.      Throw away plants if you see mold on them.      Keep your house clean and dust free.      Replace carpet with wood, tile, or vinyl flooring. Carpet can trap dander and dust.      Use allergy-proof pillows, mattress covers, and box spring covers.      Wash bed sheets and blankets every week in hot water and dry them in a dryer.      Use blankets that are made of polyester or cotton.      Wash hands frequently.  SEEK MEDICAL CARE IF:   · You have muscle aches.    · You have chest pain.    · The sputum changes from clear or   white to yellow, green, gray, or bloody.    · The sputum you cough up gets thicker.    · There are problems that may be related to the medicine you are given, such as a rash, itching, swelling, or trouble breathing.    SEEK IMMEDIATE MEDICAL CARE IF:   · You have worsening wheezing and coughing even after taking your prescribed medicines.    · You have increased difficulty breathing.    · You develop severe chest pain.  MAKE SURE YOU:   · Understand these instructions.  · Will watch your condition.  · Will get help right away if you are not doing well or get worse.     This information is not intended to replace advice given to you by your health care provider. Make sure you discuss any questions you have with your health care  provider.     Document Released: 06/29/2003 Document Revised: 07/17/2014 Document Reviewed: 12/16/2012  Elsevier Interactive Patient Education ©2016 [employer]

## 2016-03-29 ENCOUNTER — Emergency Department
Admission: EM | Admit: 2016-03-29 | Discharge: 2016-03-29 | Disposition: A | Discharge status: Home / Self Care | Payer: Self-pay | Attending: Emergency Medicine | Admitting: Emergency Medicine

## 2016-03-29 ENCOUNTER — Encounter: Payer: Self-pay | Admitting: Emergency Medicine

## 2016-03-29 DIAGNOSIS — F17200 Nicotine dependence, unspecified, uncomplicated: Secondary | ICD-10-CM | POA: Insufficient documentation

## 2016-03-29 DIAGNOSIS — J45909 Unspecified asthma, uncomplicated: Secondary | ICD-10-CM | POA: Insufficient documentation

## 2016-03-29 DIAGNOSIS — L03031 Cellulitis of right toe: Secondary | ICD-10-CM

## 2016-03-29 MED ORDER — HYDROCODONE-ACETAMINOPHEN 5-325 MG PO TABS: 1.0000 | ORAL_TABLET | ORAL | 0 refills | Status: DC | PRN

## 2016-03-29 MED ORDER — SULFAMETHOXAZOLE-TRIMETHOPRIM 800-160 MG PO TABS: 1.0000 | ORAL_TABLET | Freq: Two times a day (BID) | ORAL | 0 refills | Status: DC

## 2016-03-29 NOTE — ED Triage Notes (Signed)
Pt presents with ingrown toenail.

## 2016-03-29 NOTE — ED Provider Notes (Signed)
[facility] Emergency Department Provider Note ____________________________________________  Time seen: Approximately 7:38 AM  I have reviewed the triage vital signs and the nursing notes.   HISTORY  Chief Complaint Nail Problem    HPI Stacey Pope is a 30 y.o. female who presents to the emergency department for evaluation of right great toe pain. She states she had a pedicure yesterday and they pulled out an ingrowing nail. There was no pain until last night and it began to throb. She has not been able to sleep due to pain. She has taken ibuprofen without relief.     Past Medical History:  Diagnosis Date  . Asthma     There are no active problems to display for this patient.   History reviewed. No pertinent surgical history.  Prior to Admission medications   Medication Sig Start Date End Date Taking? Authorizing Provider  chlorpheniramine-HYDROcodone (TUSSIONEX PENNKINETIC ER) 10-8 MG/5ML SUER Take 5 mLs by mouth 2 (two) times daily. 10/19/15   Joni Reiningonald K Smith, PA-C  HYDROcodone-acetaminophen (NORCO/VICODIN) 5-325 MG tablet Take 1 tablet by mouth every 4 (four) hours as needed for moderate pain. 03/29/16 03/29/17  Chinita Pesterari B Ola Raap, FNP  ibuprofen (ADVIL,MOTRIN) 800 MG tablet Take 1 tablet (800 mg total) by mouth every 8 (eight) hours as needed for moderate pain (with food). 05/18/15   Anne-Caroline Sharma CovertNorman, MD  methylPREDNISolone (MEDROL DOSEPAK) 4 MG TBPK tablet Take Tapered dose as directed 10/11/15   Joni Reiningonald K Smith, PA-C  metroNIDAZOLE (FLAGYL) 500 MG tablet Take 1 tablet (500 mg total) by mouth 2 (two) times daily. 05/25/15   Governor Rooksebecca Lord, MD  oxyCODONE-acetaminophen (PERCOCET) 7.5-325 MG tablet Take 1 tablet by mouth every 4 (four) hours as needed for severe pain. 10/11/15 10/10/16  Joni Reiningonald K Smith, PA-C  oxyCODONE-acetaminophen (ROXICET) 5-325 MG tablet Take 1 tablet by mouth every 4 (four) hours as needed for severe pain. 05/18/15 05/17/16  Anne-Caroline Sharma CovertNorman,  MD  sulfamethoxazole-trimethoprim (BACTRIM DS,SEPTRA DS) 800-160 MG tablet Take 1 tablet by mouth 2 (two) times daily. 03/29/16   Chinita Pesterari B Soraida Vickers, FNP    Allergies Augmentin [amoxicillin-pot clavulanate]  No family history on file.  Social History Social History  Substance Use Topics  . Smoking status: Current Every Day Smoker  . Smokeless tobacco: Never Used  . Alcohol use No    Review of Systems Constitutional: No recent illness. Cardiovascular: Denies chest pain or palpitations. Respiratory: Denies shortness of breath. Musculoskeletal: Pain in right great toe. Skin: Negative for rash, wound, lesion. Neurological: Negative for focal weakness or numbness.  ____________________________________________   PHYSICAL EXAM:  VITAL SIGNS:  ED Triage Vitals  Enc Vitals Group     BP --      Pulse --      Resp --      Temp --      Temp src --      SpO2 --      Weight 03/29/16 0710 230 lb (104.3 kg)     Height --      Head Circumference --      Peak Flow --      Pain Score 03/29/16 0711 10     Pain Loc --      Pain Edu? --      Excl. in GC? --     Constitutional: Alert and oriented. Well appearing and in no acute distress. Eyes: Conjunctivae are normal. EOMI. Head: Atraumatic. Neck: No stridor.  Respiratory: Normal respiratory effort.   Musculoskeletal: Full ROM  throughout. Neurologic:  Normal speech and language. No gross focal neurologic deficits are appreciated. Speech is normal. No gait instability. Skin:  Skin is warm, dry and intact. Right great toe mildly edematous and erythematous. No obvious ingrowing nail. No purulent drainage.  Psychiatric: Mood and affect are normal. Speech and behavior are normal.  ____________________________________________   LABS (all labs ordered are listed, but only abnormal results are displayed)  Labs Reviewed - No data to display ____________________________________________  RADIOLOGY  Not  indicated. ____________________________________________   PROCEDURES  Procedure(s) performed: None   ____________________________________________   INITIAL IMPRESSION / ASSESSMENT AND PLAN / ED COURSE  Clinical Course    Pertinent labs & imaging results that were available during my care of the patient were reviewed by me and considered in my medical decision making (see chart for details).  Patient to schedule an appointment with Dr. Alberteen Spindle. She will be prescribed Bactrim and Norco (#9) tablets. She is to return to the ER for symptoms that change or worsen if unable to schedule an appointment. ____________________________________________   FINAL CLINICAL IMPRESSION(S) / ED DIAGNOSES  Final diagnoses:  Cellulitis of great toe of right foot       Chinita Pester, FNP 03/29/16 0757    Sharyn Creamer, MD 04/15/16 [phone]

## 2016-03-29 NOTE — ED Notes (Signed)
Pt states right big toe is ingrown and painful, states she is unable to see podiatry today

## 2016-05-08 ENCOUNTER — Encounter: Payer: Self-pay | Admitting: Emergency Medicine

## 2016-05-08 ENCOUNTER — Emergency Department: Payer: Self-pay

## 2016-05-08 DIAGNOSIS — J40 Bronchitis, not specified as acute or chronic: Secondary | ICD-10-CM | POA: Insufficient documentation

## 2016-05-08 DIAGNOSIS — Z79899 Other long term (current) drug therapy: Secondary | ICD-10-CM | POA: Insufficient documentation

## 2016-05-08 DIAGNOSIS — Z87891 Personal history of nicotine dependence: Secondary | ICD-10-CM | POA: Insufficient documentation

## 2016-05-08 LAB — POCT PREGNANCY, URINE: Preg Test, Ur: NEGATIVE

## 2016-05-08 NOTE — ED Triage Notes (Signed)
Patient ambulatory to triage with steady gait, without difficulty or distress noted , mask in place; pt reports prod cough x 2-3wks of brown/green sputum

## 2016-05-09 ENCOUNTER — Emergency Department
Admission: EM | Admit: 2016-05-09 | Discharge: 2016-05-09 | Disposition: A | Discharge status: Home / Self Care | Payer: Self-pay | Attending: Emergency Medicine | Admitting: Emergency Medicine

## 2016-05-09 DIAGNOSIS — R05 Cough: Secondary | ICD-10-CM

## 2016-05-09 DIAGNOSIS — R059 Cough, unspecified: Secondary | ICD-10-CM

## 2016-05-09 DIAGNOSIS — J40 Bronchitis, not specified as acute or chronic: Secondary | ICD-10-CM

## 2016-05-09 DIAGNOSIS — R0602 Shortness of breath: Secondary | ICD-10-CM

## 2016-05-09 MED ORDER — PREDNISONE 20 MG PO TABS: 60.0000 mg | ORAL_TABLET | Freq: Every day | ORAL | 0 refills | Status: DC

## 2016-05-09 MED ORDER — IPRATROPIUM-ALBUTEROL 0.5-2.5 (3) MG/3ML IN SOLN
3.0000 mL | Freq: Once | RESPIRATORY_TRACT | Status: AC
  Administered 2016-05-09: 3 mL via RESPIRATORY_TRACT
  Filled 2016-05-09: qty 3

## 2016-05-09 MED ORDER — PREDNISONE 20 MG PO TABS
60.0000 mg | ORAL_TABLET | Freq: Once | ORAL | Status: AC
  Administered 2016-05-09: 60 mg via ORAL
  Filled 2016-05-09: qty 3

## 2016-05-09 MED ORDER — AZITHROMYCIN 250 MG PO TABS: ORAL_TABLET | ORAL | 0 refills | Status: AC

## 2016-05-09 MED ORDER — AZITHROMYCIN 500 MG PO TABS
500.0000 mg | ORAL_TABLET | Freq: Once | ORAL | Status: AC
  Administered 2016-05-09: 500 mg via ORAL
  Filled 2016-05-09: qty 1

## 2016-05-09 MED ORDER — ALBUTEROL SULFATE HFA 108 (90 BASE) MCG/ACT IN AERS: 2.0000 | INHALATION_SPRAY | Freq: Four times a day (QID) | RESPIRATORY_TRACT | 0 refills | Status: DC | PRN

## 2016-05-09 MED ORDER — BENZONATATE 100 MG PO CAPS
100.0000 mg | ORAL_CAPSULE | Freq: Once | ORAL | Status: AC
  Administered 2016-05-09: 100 mg via ORAL
  Filled 2016-05-09: qty 1

## 2016-05-09 NOTE — ED Notes (Signed)
Discharge instructions reviewed with patient. Patient verbalized understanding. Patient ambulated to lobby without difficulty.   

## 2016-05-09 NOTE — ED Provider Notes (Signed)
[facility] Emergency Department Provider Note   ____________________________________________   First MD Initiated Contact with Patient 05/09/16 0151     (approximate)  I have reviewed the triage vital signs and the nursing notes.   HISTORY  Chief Complaint Cough    HPI Stacey Pope is a 30 y.o. female who comes into the hospital today after being sick for 2-1/2 weeks. She reports that she's been having a cough that seems to be getting worse and having difficulty breathing. She reports that she coughs so much at night that she's pulled muscles in her back. The patient reports now whenever she coughs she is unable to get anything up. Initially the patient is able to produce some sputum but now she is unable to and she feels as though something's in her chest. The patient has a history of asthma and has been using her mother's inhaler as she does not have any insurance. She reports that in helping a little bit but she still really tight in her chest. She reports that she can barely move around due to her shortness of breath. She has been using guaifenesin and Robitussin. 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 She reports that she had some fever initially but that has resolved. The patient has had some nausea and posttussive emesis. She denies overt chest pain but has felt tight in her chest. The patient is here for evaluation.   Past Medical History:  Diagnosis Date  . Asthma     There are no active problems to display for this patient.   History reviewed. No pertinent surgical history.  Prior to Admission medications   Medication Sig Start Date End Date Taking? Authorizing Provider  albuterol (PROVENTIL HFA;VENTOLIN HFA) 108 (90 Base) MCG/ACT inhaler Inhale 2 puffs into the lungs every 6 (six) hours as needed for wheezing or shortness of breath. 05/09/16   Rebecka ApleyAllison P Challis Crill, MD  azithromycin (ZITHROMAX Z-PAK) 250 MG  tablet Take 2 tablets (500 mg) on  Day 1,  followed by 1 tablet (250 mg) once daily on Days 2 through 5. 05/09/16 05/14/16  Rebecka ApleyAllison P Twisha Vanpelt, MD  chlorpheniramine-HYDROcodone [facility](TUSSIONEX PENNKINETIC ER) 10-8 MG/5ML SUER Take 5 mLs by mouth 2 (two) times daily. 10/19/15   Joni Reiningonald K Smith, PA-C  HYDROcodone-acetaminophen (NORCO/VICODIN) 5-325 MG tablet Take 1 tablet by mouth every 4 (four) hours as needed for moderate pain. 03/29/16 03/29/17  Chinita Pesterari B Triplett, FNP  ibuprofen (ADVIL,MOTRIN) 800 MG tablet Take 1 tablet (800 mg total) by mouth every 8 (eight) hours as needed for moderate pain (with food). 05/18/15   Anne-Caroline Sharma CovertNorman, MD  methylPREDNISolone (MEDROL DOSEPAK) 4 MG TBPK tablet Take Tapered dose as directed 10/11/15   Joni Reiningonald K Smith, PA-C  metroNIDAZOLE (FLAGYL) 500 MG tablet Take 1 tablet (500 mg total) by mouth 2 (two) times daily. 05/25/15   Governor Rooksebecca Lord, MD  oxyCODONE-acetaminophen (PERCOCET) 7.5-325 MG tablet Take 1 tablet by mouth every 4 (four) hours as needed for severe pain. 10/11/15 10/10/16  Joni Reiningonald K Smith, PA-C  oxyCODONE-acetaminophen (ROXICET) 5-325 MG tablet Take 1 tablet by mouth every 4 (four) hours as needed for severe pain. 05/18/15 05/17/16  Anne-Caroline Sharma CovertNorman, MD  predniSONE (DELTASONE) 20 MG tablet Take 3 tablets (60 mg total) by mouth daily. 05/09/16   Rebecka ApleyAllison P Lilyann Gravelle, MD  sulfamethoxazole-trimethoprim (BACTRIM DS,SEPTRA DS) 800-160 MG tablet Take 1 tablet by mouth 2 (two) times daily. 03/29/16   Chinita Pesterari B Triplett,  FNP    Allergies Augmentin [amoxicillin-pot clavulanate]  No family history on file.  Social History Social History  Substance Use Topics  . Smoking status: Former Games developer  . Smokeless tobacco: Never Used  . Alcohol use No    Review of Systems Constitutional: No fever/chills Eyes: No visual changes. ENT: No sore throat. Cardiovascular: Denies chest pain. Respiratory: Cough and shortness of breath. Gastrointestinal: No abdominal pain.  No nausea, no  vomiting.  No diarrhea.  No constipation. Genitourinary: Negative for dysuria. Musculoskeletal: Negative for back pain. Skin: Negative for rash. Neurological: Negative for headaches, focal weakness or numbness.  10-point ROS otherwise negative.  ____________________________________________   PHYSICAL EXAM:  VITAL SIGNS: ED Triage Vitals  Enc Vitals Group     BP 05/08/16 2305 110/69     Pulse Rate 05/08/16 2305 80     Resp 05/08/16 2305 18     Temp 05/08/16 2305 98.2 F (36.8 C)     Temp Source 05/08/16 2305 Oral     SpO2 05/08/16 2305 97 %     Weight 05/08/16 2303 230 lb (104.3 kg)     Height 05/08/16 2303 5\' 5"  (1.651 m)     Head Circumference --      Peak Flow --      Pain Score --      Pain Loc --      Pain Edu? --      Excl. in GC? --     Constitutional: Alert and oriented. Well appearing and in moderate distress. Eyes: Conjunctivae are normal. PERRL. EOMI. Head: Atraumatic. Nose: No congestion/rhinnorhea. Mouth/Throat: Mucous membranes are moist.  Oropharynx non-erythematous. Cardiovascular: Normal rate, regular rhythm. Grossly normal heart sounds.  Good peripheral circulation. Respiratory: Increased respiratory effort.  No retractions. Expiratory wheezes throughout all lung fields. Gastrointestinal: Soft and nontender. No distention. Positive bowel sounds Musculoskeletal: No lower extremity tenderness nor edema.   Neurologic:  Normal speech and language.  Skin:  Skin is warm, dry and intact.  Psychiatric: Mood and affect are normal.   ____________________________________________   LABS (all labs ordered are listed, but only abnormal results are displayed)  Labs Reviewed  POCT PREGNANCY, URINE   ____________________________________________  EKG  none ____________________________________________  RADIOLOGY  CXR ____________________________________________   PROCEDURES  Procedure(s) performed: None  Procedures  Critical Care performed:  No  ____________________________________________   INITIAL IMPRESSION / ASSESSMENT AND PLAN / ED COURSE  Pertinent labs & imaging results that were available during my care of the patient were reviewed by me and considered in my medical decision making (see chart for details).  This is a 30 year old female who comes into the hospital today with a persistent cough and shortness of breath. The patient reports she's been having these symptoms for 2 weeks but it has not been getting any better. The patient has been using her mother's inhaler which has helped but she still been feeling very tight in her chest. The patient has some significant wheezing on her exam. I will give the patient 3 duo nebs as well as some prednisone and azithromycin for bronchitis. I will reassess the patient when she's receive her medications. The patient will also receive a dose of benzonatate for her cough  Clinical Course  Value Comment By Time  DG Chest 2 View No active cardiopulmonary disease. Rebecka Apley, MD 10/31 [phone]   After the medications the patient's wheezing is improved significantly. She also reports that she feels better. I will discharge the patient to home with  some steroids. I will also give the patient a work note. She will be discharged to follow-up with the acute care clinic.  ____________________________________________   FINAL CLINICAL IMPRESSION(S) / ED DIAGNOSES  Final diagnoses:  Cough  Bronchitis  Shortness of breath      NEW MEDICATIONS STARTED DURING THIS VISIT:  New Prescriptions   ALBUTEROL (PROVENTIL HFA;VENTOLIN HFA) 108 (90 BASE) MCG/ACT INHALER    Inhale 2 puffs into the lungs every 6 (six) hours as needed for wheezing or shortness of breath.   AZITHROMYCIN (ZITHROMAX Z-PAK) 250 MG TABLET    Take 2 tablets (500 mg) on  Day 1,  followed by 1 tablet (250 mg) once daily on Days 2 through 5.   PREDNISONE (DELTASONE) 20 MG TABLET    Take 3 tablets (60 mg total) by mouth  daily.     Note:  This document was prepared using Dragon voice recognition software and may include unintentional dictation errors.    Rebecka ApleyAllison P Raheim Beutler, MD 05/09/16 [phone]

## 2016-09-13 ENCOUNTER — Encounter: Payer: Self-pay | Admitting: *Deleted

## 2016-09-13 ENCOUNTER — Emergency Department
Admission: EM | Admit: 2016-09-13 | Discharge: 2016-09-13 | Disposition: A | Discharge status: Home / Self Care | Payer: Self-pay | Attending: Emergency Medicine | Admitting: Emergency Medicine

## 2016-09-13 ENCOUNTER — Emergency Department: Payer: Self-pay

## 2016-09-13 DIAGNOSIS — Y999 Unspecified external cause status: Secondary | ICD-10-CM | POA: Insufficient documentation

## 2016-09-13 DIAGNOSIS — Z79899 Other long term (current) drug therapy: Secondary | ICD-10-CM | POA: Insufficient documentation

## 2016-09-13 DIAGNOSIS — J45909 Unspecified asthma, uncomplicated: Secondary | ICD-10-CM | POA: Insufficient documentation

## 2016-09-13 DIAGNOSIS — Z87891 Personal history of nicotine dependence: Secondary | ICD-10-CM | POA: Insufficient documentation

## 2016-09-13 DIAGNOSIS — S39012A Strain of muscle, fascia and tendon of lower back, initial encounter: Secondary | ICD-10-CM | POA: Insufficient documentation

## 2016-09-13 DIAGNOSIS — W172XXA Fall into hole, initial encounter: Secondary | ICD-10-CM | POA: Insufficient documentation

## 2016-09-13 DIAGNOSIS — Y9301 Activity, walking, marching and hiking: Secondary | ICD-10-CM | POA: Insufficient documentation

## 2016-09-13 DIAGNOSIS — M62830 Muscle spasm of back: Secondary | ICD-10-CM

## 2016-09-13 DIAGNOSIS — Y92007 Garden or yard of unspecified non-institutional (private) residence as the place of occurrence of the external cause: Secondary | ICD-10-CM | POA: Insufficient documentation

## 2016-09-13 LAB — POCT PREGNANCY, URINE: PREG TEST UR: NEGATIVE

## 2016-09-13 MED ORDER — KETOROLAC TROMETHAMINE 60 MG/2ML IM SOLN
60.0000 mg | Freq: Once | INTRAMUSCULAR | Status: AC
  Administered 2016-09-13: 60 mg via INTRAMUSCULAR
  Filled 2016-09-13: qty 2

## 2016-09-13 MED ORDER — HYDROCODONE-ACETAMINOPHEN 5-325 MG PO TABS: 1.0000 | ORAL_TABLET | Freq: Three times a day (TID) | ORAL | 0 refills | Status: DC | PRN

## 2016-09-13 MED ORDER — KETOROLAC TROMETHAMINE 10 MG PO TABS: 10.0000 mg | ORAL_TABLET | Freq: Three times a day (TID) | ORAL | 0 refills | Status: DC

## 2016-09-13 MED ORDER — CYCLOBENZAPRINE HCL 5 MG PO TABS: 5.0000 mg | ORAL_TABLET | Freq: Three times a day (TID) | ORAL | 0 refills | Status: DC | PRN

## 2016-09-13 NOTE — Discharge Instructions (Signed)
Your exam and x-rays are normal following your fall. You should take the prescription meds as directed. Apply ice and/or moist heat to any sore muscles. Follow-up with Dr. Judithann SheenSparks for continued muscle pain.

## 2016-09-13 NOTE — ED Provider Notes (Signed)
[facility] Emergency Department Provider Note ____________________________________________  Time seen: 1729  I have reviewed the triage vital signs and the nursing notes.  HISTORY  Chief Complaint  Back Pain   HPI Stacey Pope is a 31 y.o. female presents to the ED for evaluation of back pain after a fall. She reports stepping into a divet in the yard while walking the dog. This caused her to fall forward onto her knees. She describes pain to the lower back in the midline. She reports some mild muscle pain along the lateral right hip and thigh. She denies incontinence or foot drop. She drove herself to the ED for evaluation.   Past Medical History:  Diagnosis Date  . Asthma     There are no active problems to display for this patient.  History reviewed. No pertinent surgical history.  Prior to Admission medications   Medication Sig Start Date End Date Taking? Authorizing Provider  albuterol (PROVENTIL HFA;VENTOLIN HFA) 108 (90 Base) MCG/ACT inhaler Inhale 2 puffs into the lungs every 6 (six) hours as needed for wheezing or shortness of breath. 05/09/16   Rebecka ApleyAllison P Webster, MD  chlorpheniramine-HYDROcodone [facility](TUSSIONEX PENNKINETIC ER) 10-8 MG/5ML SUER Take 5 mLs by mouth 2 (two) times daily. 10/19/15   Joni Reiningonald K Smith, PA-C  cyclobenzaprine (FLEXERIL) 5 MG tablet Take 1 tablet (5 mg total) by mouth 3 (three) times daily as needed for muscle spasms. 09/13/16   Kalilah Barua V Bacon Denisia Harpole, PA-C  HYDROcodone-acetaminophen (NORCO) 5-325 MG tablet Take 1 tablet by mouth 3 (three) times daily as needed for severe pain. 09/13/16   Rabon Scholle V Bacon Jentry Mcqueary, PA-C  ibuprofen (ADVIL,MOTRIN) 800 MG tablet Take 1 tablet (800 mg total) by mouth every 8 (eight) hours as needed for moderate pain (with food). 05/18/15   Rockne MenghiniAnne-Caroline Norman, MD  ketorolac (TORADOL) 10 MG tablet Take 1 tablet (10 mg total) by mouth every 8 (eight) hours. 09/13/16   Tacora Athanas V Bacon Antrone Walla, PA-C   methylPREDNISolone (MEDROL DOSEPAK) 4 MG TBPK tablet Take Tapered dose as directed 10/11/15   Joni Reiningonald K Smith, PA-C  metroNIDAZOLE (FLAGYL) 500 MG tablet Take 1 tablet (500 mg total) by mouth 2 (two) times daily. 05/25/15   Governor Rooksebecca Lord, MD  oxyCODONE-acetaminophen (PERCOCET) 7.5-325 MG tablet Take 1 tablet by mouth every 4 (four) hours as needed for severe pain. 10/11/15 10/10/16  Joni Reiningonald K Smith, PA-C  predniSONE (DELTASONE) 20 MG tablet Take 3 tablets (60 mg total) by mouth daily. 05/09/16   Rebecka ApleyAllison P Webster, MD  sulfamethoxazole-trimethoprim (BACTRIM DS,SEPTRA DS) 800-160 MG tablet Take 1 tablet by mouth 2 (two) times daily. 03/29/16   Chinita Pesterari B Triplett, FNP    Allergies Augmentin [amoxicillin-pot clavulanate]  History reviewed. No pertinent family history.  Social History Social History  Substance Use Topics  . Smoking status: Former Games developermoker  . Smokeless tobacco: Never Used  . Alcohol use No    Review of Systems  Constitutional: Negative for fever. Cardiovascular: Negative for chest pain. Respiratory: Negative for shortness of breath. Gastrointestinal: Negative for abdominal pain, vomiting and diarrhea. Genitourinary: Negative for dysuria. Musculoskeletal: Positive for back pain. Skin: Negative for rash. Neurological: Negative for headaches, focal weakness or numbness. ____________________________________________  PHYSICAL EXAM:  VITAL SIGNS: ED Triage Vitals  Enc Vitals Group     BP 09/13/16 1659 (!) 141/91     Pulse Rate 09/13/16 1659 88     Resp 09/13/16 1659 18     Temp 09/13/16 1659 97.6 F (36.4 C)  Temp Source 09/13/16 1659 Oral     SpO2 09/13/16 1659 100 %     Weight 09/13/16 1659 230 lb (104.3 kg)     Height 09/13/16 1659 5\' 6"  (1.676 m)     Head Circumference --      Peak Flow --      Pain Score 09/13/16 1658 10     Pain Loc --      Pain Edu? --      Excl. in GC? --    Constitutional: Alert and oriented. Well appearing and in no distress. Head:  Normocephalic and atraumatic. Cardiovascular: Normal rate, regular rhythm. Normal distal pulses. Respiratory: Normal respiratory effort. No wheezes/rales/rhonchi. Gastrointestinal: Soft and nontender. No distention. Musculoskeletal: Normal spinal alignment without midline tenderness, spasm, deformity, or step-off. Patient with tenderness to palpation along the lumbar sacral paraspinals. She transitions from sit to stand without assistance. She is able to normal toe raise and heel raise on exam. Nontender with normal range of motion in all extremities.  Neurologic:  Antalgic gait without ataxia. Cranial nerves II through XII grossly intact. Normal LE DTRs bilaterally. Normal toe raise and foot eversion on exam. Normal speech and language. No gross focal neurologic deficits are appreciated. Skin:  Skin is warm, dry and intact. No rash noted. Psychiatric: Mood and affect are normal. Patient exhibits appropriate insight and judgment. ____________________________________________   LABS (pertinent positives/negatives) Labs Reviewed  POC URINE PREG, ED  POCT PREGNANCY, URINE  ____________________________________________   RADIOLOGY  LS Spine Complete  IMPRESSION: Negative.  I, Adelle Zachar, Charlesetta Ivory, personally viewed and evaluated these images (plain radiographs) as part of my medical decision making, as well as reviewing the written report by the radiologist. ____________________________________________  PROCEDURES  Toradol 60 mg IM ____________________________________________  INITIAL IMPRESSION / ASSESSMENT AND PLAN / ED COURSE  Patient with acute lumbar strain without acute evidence of fracture or dislocation. Her disc space appeared to be well maintained without any signs of foraminal stenosis. Patient's symptoms and exam are benign at this time no indication of any acute nerve impingement. No neuromuscular deficit is noted on exam. She is discharged with prescriptions for Flexeril,  Toradol, and hydrocodone. She will follow with her primary care provider or return to the ED if needed. A work note is provided for 2 days as requested. ____________________________________________  FINAL CLINICAL IMPRESSION(S) / ED DIAGNOSES  Final diagnoses:  Strain of lumbar region, initial encounter  Muscle spasm of back      Lissa Hoard, PA-C 09/13/16 2350    Minna Antis, MD 09/14/16 [phone]

## 2016-09-13 NOTE — ED Notes (Signed)
See triage note   States she was walking the dog  Stepped in hole  [place]  Having pain to mid lower back   States pain radiates into right leg

## 2016-09-13 NOTE — ED Triage Notes (Signed)
Pt reports she fell walking dog, reports now with back pain. Pt with hx of chronic back pain, pt tearful in triage.

## 2017-03-22 ENCOUNTER — Emergency Department
Admission: EM | Admit: 2017-03-22 | Discharge: 2017-03-22 | Disposition: A | Discharge status: Home / Self Care | Payer: Self-pay | Attending: Emergency Medicine | Admitting: Emergency Medicine

## 2017-03-22 ENCOUNTER — Encounter: Payer: Self-pay | Admitting: Emergency Medicine

## 2017-03-22 ENCOUNTER — Emergency Department: Payer: Self-pay

## 2017-03-22 DIAGNOSIS — J45901 Unspecified asthma with (acute) exacerbation: Secondary | ICD-10-CM | POA: Insufficient documentation

## 2017-03-22 DIAGNOSIS — Z79899 Other long term (current) drug therapy: Secondary | ICD-10-CM | POA: Insufficient documentation

## 2017-03-22 DIAGNOSIS — Z87891 Personal history of nicotine dependence: Secondary | ICD-10-CM | POA: Insufficient documentation

## 2017-03-22 MED ORDER — PREDNISONE 20 MG PO TABS: 60.0000 mg | ORAL_TABLET | Freq: Every day | ORAL | 0 refills | Status: AC

## 2017-03-22 MED ORDER — ALBUTEROL SULFATE HFA 108 (90 BASE) MCG/ACT IN AERS: 2.0000 | INHALATION_SPRAY | Freq: Four times a day (QID) | RESPIRATORY_TRACT | 0 refills | Status: DC | PRN

## 2017-03-22 MED ORDER — IPRATROPIUM-ALBUTEROL 0.5-2.5 (3) MG/3ML IN SOLN
RESPIRATORY_TRACT | Status: AC
  Administered 2017-03-22: 6 mL via RESPIRATORY_TRACT
  Filled 2017-03-22: qty 6

## 2017-03-22 MED ORDER — ACETAMINOPHEN 325 MG PO TABS: 650.0000 mg | ORAL_TABLET | Freq: Once | ORAL | Status: DC | PRN

## 2017-03-22 MED ORDER — BENZONATATE 100 MG PO CAPS
100.0000 mg | ORAL_CAPSULE | Freq: Once | ORAL | Status: AC
  Administered 2017-03-22: 100 mg via ORAL
  Filled 2017-03-22: qty 1

## 2017-03-22 MED ORDER — ALBUTEROL SULFATE (2.5 MG/3ML) 0.083% IN NEBU
2.5000 mg | INHALATION_SOLUTION | Freq: Once | RESPIRATORY_TRACT | Status: AC
  Administered 2017-03-22: 2.5 mg via RESPIRATORY_TRACT
  Filled 2017-03-22: qty 3

## 2017-03-22 MED ORDER — AZITHROMYCIN 500 MG PO TABS
500.0000 mg | ORAL_TABLET | Freq: Once | ORAL | Status: AC
  Administered 2017-03-22: 500 mg via ORAL
  Filled 2017-03-22: qty 1

## 2017-03-22 MED ORDER — PREDNISONE 20 MG PO TABS
60.0000 mg | ORAL_TABLET | Freq: Once | ORAL | Status: AC
  Administered 2017-03-22: 60 mg via ORAL
  Filled 2017-03-22: qty 3

## 2017-03-22 MED ORDER — IPRATROPIUM-ALBUTEROL 0.5-2.5 (3) MG/3ML IN SOLN
3.0000 mL | Freq: Once | RESPIRATORY_TRACT | Status: DC
  Filled 2017-03-22: qty 3

## 2017-03-22 MED ORDER — IPRATROPIUM-ALBUTEROL 0.5-2.5 (3) MG/3ML IN SOLN
3.0000 mL | Freq: Once | RESPIRATORY_TRACT | Status: AC
  Administered 2017-03-22: 6 mL via RESPIRATORY_TRACT

## 2017-03-22 MED ORDER — ALBUTEROL SULFATE (2.5 MG/3ML) 0.083% IN NEBU
2.5000 mg | INHALATION_SOLUTION | Freq: Once | RESPIRATORY_TRACT | Status: AC
Start: 2017-03-22 — End: 2017-03-22
  Administered 2017-03-22: 2.5 mg via RESPIRATORY_TRACT
  Filled 2017-03-22: qty 3

## 2017-03-22 NOTE — ED Triage Notes (Addendum)
Pt to triage via w/c with no distress noted; pt reports had panic attack at work tonight due to coughing; st nonprod cough several weeks, wheezing, with hx bronchitis; pt with congested cough noted; st generalized HA from frequent coughing; pt reports quitting smoking in May

## 2017-03-22 NOTE — ED Notes (Signed)

## 2017-03-22 NOTE — ED Notes (Signed)
ED Provider at bedside. 

## 2017-03-22 NOTE — ED Provider Notes (Signed)
[facility] Emergency Department Provider Note   First MD Initiated Contact with Patient 03/22/17 [phone]     (approximate)  I have reviewed the triage vital signs and the nursing notes.   HISTORY  Chief Complaint Cough    HPI Stacey Pope is a 31 y.o. female with history of asthma presents to the emergency department with nonproductive cough times "several weeks accompanied by wheezing, congestion. Patient states headache secondary to frequent coughing. Patient admits to history of tobacco use which she stopped in May of this year. Patient denies any fever or afebrile on presentation. Patient denies any chest pain. Patient denies any lower external pain or swelling.   Past Medical History:  Diagnosis Date  . Asthma     There are no active problems to display for this patient.   History reviewed. No pertinent surgical history.  Prior to Admission medications   Medication Sig Start Date End Date Taking? Authorizing Provider  albuterol (PROVENTIL HFA;VENTOLIN HFA) 108 (90 Base) MCG/ACT inhaler Inhale 2 puffs into the lungs every 6 (six) hours as needed for wheezing or shortness of breath. 05/09/16   Rebecka Apley, MD  chlorpheniramine-HYDROcodone [facility] PENNKINETIC ER) 10-8 MG/5ML SUER Take 5 mLs by mouth 2 (two) times daily. 10/19/15   Joni Reining, PA-C  cyclobenzaprine (FLEXERIL) 5 MG tablet Take 1 tablet (5 mg total) by mouth 3 (three) times daily as needed for muscle spasms. 09/13/16   Menshew, Charlesetta Ivory, PA-C  HYDROcodone-acetaminophen (NORCO) 5-325 MG tablet Take 1 tablet by mouth 3 (three) times daily as needed for severe pain. 09/13/16   Menshew, Charlesetta Ivory, PA-C  ibuprofen (ADVIL,MOTRIN) 800 MG tablet Take 1 tablet (800 mg total) by mouth every 8 (eight) hours as needed for moderate pain (with food). 05/18/15   Rockne Menghini, MD  ketorolac (TORADOL) 10 MG tablet Take 1 tablet (10 mg total) by mouth every 8 (eight) hours.  09/13/16   Menshew, Charlesetta Ivory, PA-C  methylPREDNISolone (MEDROL DOSEPAK) 4 MG TBPK tablet Take Tapered dose as directed 10/11/15   Joni Reining, PA-C  metroNIDAZOLE (FLAGYL) 500 MG tablet Take 1 tablet (500 mg total) by mouth 2 (two) times daily. 05/25/15   Governor Rooks, MD  predniSONE (DELTASONE) 20 MG tablet Take 3 tablets (60 mg total) by mouth daily. 05/09/16   Rebecka Apley, MD  sulfamethoxazole-trimethoprim (BACTRIM DS,SEPTRA DS) 800-160 MG tablet Take 1 tablet by mouth 2 (two) times daily. 03/29/16   Triplett, Rulon Eisenmenger B, FNP    Allergies Augmentin [amoxicillin-pot clavulanate]  No family history on file.  Social History Social History  Substance Use Topics  . Smoking status: Former Games developer  . Smokeless tobacco: Never Used  . Alcohol use No    Review of Systems Constitutional: No fever/chills Eyes: No visual changes. ENT: No sore throat. Cardiovascular: Denies chest pain. Respiratory: Denies shortness of breath. Gastrointestinal: No abdominal pain.  No nausea, no vomiting.  No diarrhea.  No constipation. Genitourinary: Negative for dysuria. Musculoskeletal: Negative for neck pain.  Negative for back pain. Integumentary: Negative for rash. Neurological: Negative for headaches, focal weakness or numbness.  ____________________________________________   PHYSICAL EXAM:  VITAL SIGNS: ED Triage Vitals  Enc Vitals Group     BP 03/22/17 0108 (!) 142/93     Pulse Rate 03/22/17 0108 77     Resp 03/22/17 0108 18     Temp 03/22/17 0108 99 F (37.2 C)     Temp Source 03/22/17 0108 Oral  SpO2 03/22/17 0108 95 %     Weight 03/22/17 0105 104.3 kg (230 lb)     Height 03/22/17 0105 1.651 m (5\' 5" )     Head Circumference --      Peak Flow --      Pain Score 03/22/17 0104 8     Pain Loc --      Pain Edu? --      Excl. in GC? --     Constitutional: Alert and oriented. Well appearing and in no acute distress. Eyes: Conjunctivae are normal.  Head:  Atraumatic. Mouth/Throat: Mucous membranes are moist. Oropharynx non-erythematous. Neck: No stridor.   Cardiovascular: Normal rate, regular rhythm. Good peripheral circulation. Grossly normal heart sounds. Respiratory: Normal respiratory effort.  No retractions. Lungs CTAB. Gastrointestinal: Soft and nontender. No distention.  Musculoskeletal: No lower extremity tenderness nor edema. No gross deformities of extremities. Neurologic:  Normal speech and language. No gross focal neurologic deficits are appreciated.  Skin:  Skin is warm, dry and intact. No rash noted. Psychiatric: Mood and affect are normal. Speech and behavior are normal.    RADIOLOGY I, [place] N Avonna Iribe, personally viewed and evaluated these images (plain radiographs) as part of my medical decision making, as well as reviewing the written report by the radiologist.  Dg Chest 2 View  Result Date: 03/22/2017 CLINICAL DATA:  Nonproductive cough for several weeks. EXAM: CHEST  2 VIEW COMPARISON:  PA and lateral chest 05/08/2016. FINDINGS: Minimal linear atelectasis left lung base noted. Lungs otherwise clear. Heart size normal. No pneumothorax or pleural effusion. No bony abnormality. IMPRESSION: No acute disease. Electronically Signed   By: Drusilla Kannerhomas  Dalessio M.D.   On: 03/22/2017 01:21     Procedures   ____________________________________________   INITIAL IMPRESSION / ASSESSMENT AND PLAN / ED COURSE  Pertinent labs & imaging results that were available during my care of the patient were reviewed by me and considered in my medical decision making (see chart for details).  31 year old female presenting with history of physical exam consistent with acute asthma exacerbation. Patient given DuoNeb's 2 prednisone 60 mg. Patient be prescribed albuterol inhaler for home Tessalon and prednisone.      ____________________________________________  FINAL CLINICAL IMPRESSION(S) / ED DIAGNOSES  Final diagnoses:  Moderate  asthma with exacerbation, unspecified whether persistent     MEDICATIONS GIVEN DURING THIS VISIT:  Medications  acetaminophen (TYLENOL) tablet 650 mg (not administered)  ipratropium-albuterol (DUONEB) 0.5-2.5 (3) MG/3ML nebulizer solution 3 mL (not administered)  ipratropium-albuterol (DUONEB) 0.5-2.5 (3) MG/3ML nebulizer solution 3 mL (6 mLs Nebulization Given 03/22/17 0356)  predniSONE (DELTASONE) tablet 60 mg (60 mg Oral Given 03/22/17 0421)  albuterol (PROVENTIL) (2.5 MG/3ML) 0.083% nebulizer solution 2.5 mg (2.5 mg Inhalation Given 03/22/17 0421)     NEW OUTPATIENT MEDICATIONS STARTED DURING THIS VISIT:  New Prescriptions   No medications on file    Modified Medications   No medications on file    Discontinued Medications   No medications on file     Note:  This document was prepared using Dragon voice recognition software and may include unintentional dictation errors.    Darci CurrentBrown, [place] N, MD 03/22/17 [phone]

## 2017-03-25 ENCOUNTER — Other Ambulatory Visit: Payer: Self-pay

## 2017-03-25 ENCOUNTER — Encounter: Payer: Self-pay | Admitting: Emergency Medicine

## 2017-03-25 ENCOUNTER — Emergency Department
Admission: EM | Admit: 2017-03-25 | Discharge: 2017-03-25 | Disposition: A | Discharge status: Home / Self Care | Payer: Self-pay | Attending: Emergency Medicine | Admitting: Emergency Medicine

## 2017-03-25 ENCOUNTER — Emergency Department: Payer: Self-pay

## 2017-03-25 DIAGNOSIS — J45901 Unspecified asthma with (acute) exacerbation: Secondary | ICD-10-CM | POA: Insufficient documentation

## 2017-03-25 DIAGNOSIS — J45909 Unspecified asthma, uncomplicated: Secondary | ICD-10-CM

## 2017-03-25 DIAGNOSIS — Z87891 Personal history of nicotine dependence: Secondary | ICD-10-CM | POA: Insufficient documentation

## 2017-03-25 DIAGNOSIS — J40 Bronchitis, not specified as acute or chronic: Secondary | ICD-10-CM

## 2017-03-25 DIAGNOSIS — R059 Cough, unspecified: Secondary | ICD-10-CM

## 2017-03-25 DIAGNOSIS — Z79899 Other long term (current) drug therapy: Secondary | ICD-10-CM | POA: Insufficient documentation

## 2017-03-25 DIAGNOSIS — R05 Cough: Secondary | ICD-10-CM

## 2017-03-25 LAB — CBC
HCT: 39.3 % (ref 35.0–47.0)
Hemoglobin: 13.5 g/dL (ref 12.0–16.0)
MCH: 30.5 pg (ref 26.0–34.0)
MCHC: 34.2 g/dL (ref 32.0–36.0)
MCV: 88.9 fL (ref 80.0–100.0)
PLATELETS: 276 10*3/uL (ref 150–440)
RBC: 4.42 MIL/uL (ref 3.80–5.20)
RDW: 14.3 % (ref 11.5–14.5)
WBC: 17.7 10*3/uL — AB (ref 3.6–11.0)

## 2017-03-25 LAB — BASIC METABOLIC PANEL
ANION GAP: 7 (ref 5–15)
BUN: 18 mg/dL (ref 6–20)
CALCIUM: 8.7 mg/dL — AB (ref 8.9–10.3)
CO2: 24 mmol/L (ref 22–32)
Chloride: 104 mmol/L (ref 101–111)
Creatinine, Ser: 0.68 mg/dL (ref 0.44–1.00)
GFR calc Af Amer: >60 mL/min (ref >60)
GLUCOSE: 85 mg/dL (ref 65–99)
Potassium: 4.6 mmol/L (ref 3.5–5.1)
Sodium: 135 mmol/L (ref 135–145)

## 2017-03-25 LAB — TROPONIN I: Troponin I: <0.03 ng/mL (ref <0.03)

## 2017-03-25 MED ORDER — IPRATROPIUM-ALBUTEROL 0.5-2.5 (3) MG/3ML IN SOLN
3.0000 mL | Freq: Once | RESPIRATORY_TRACT | Status: AC
  Administered 2017-03-25: 3 mL via RESPIRATORY_TRACT

## 2017-03-25 MED ORDER — IPRATROPIUM-ALBUTEROL 0.5-2.5 (3) MG/3ML IN SOLN
3.0000 mL | Freq: Once | RESPIRATORY_TRACT | Status: AC
  Administered 2017-03-25: 3 mL via RESPIRATORY_TRACT
  Filled 2017-03-25: qty 3

## 2017-03-25 MED ORDER — METHYLPREDNISOLONE SODIUM SUCC 125 MG IJ SOLR
125.0000 mg | Freq: Once | INTRAMUSCULAR | Status: AC
  Administered 2017-03-25: 125 mg via INTRAVENOUS
  Filled 2017-03-25: qty 2

## 2017-03-25 MED ORDER — KETOROLAC TROMETHAMINE 30 MG/ML IJ SOLN
30.0000 mg | Freq: Once | INTRAMUSCULAR | Status: AC
  Administered 2017-03-25: 30 mg via INTRAVENOUS
  Filled 2017-03-25: qty 1

## 2017-03-25 MED ORDER — IPRATROPIUM-ALBUTEROL 0.5-2.5 (3) MG/3ML IN SOLN
RESPIRATORY_TRACT | Status: AC
  Administered 2017-03-25: 3 mL via RESPIRATORY_TRACT
  Filled 2017-03-25: qty 3

## 2017-03-25 MED ORDER — AZITHROMYCIN 500 MG PO TABS
500.0000 mg | ORAL_TABLET | Freq: Once | ORAL | Status: AC
  Administered 2017-03-25: 500 mg via ORAL
  Filled 2017-03-25: qty 1

## 2017-03-25 MED ORDER — MAGNESIUM SULFATE 2 GM/50ML IV SOLN
2.0000 g | Freq: Once | INTRAVENOUS | Status: AC
  Administered 2017-03-25: 2 g via INTRAVENOUS
  Filled 2017-03-25: qty 50

## 2017-03-25 MED ORDER — ALBUTEROL SULFATE HFA 108 (90 BASE) MCG/ACT IN AERS
2.0000 | INHALATION_SPRAY | Freq: Once | RESPIRATORY_TRACT | Status: AC
Start: 2017-03-25 — End: 2017-03-25
  Administered 2017-03-25: 2 via RESPIRATORY_TRACT
  Filled 2017-03-25: qty 6.7

## 2017-03-25 MED ORDER — AZITHROMYCIN 250 MG PO TABS: ORAL_TABLET | ORAL | 0 refills | Status: AC

## 2017-03-25 NOTE — ED Triage Notes (Signed)
Pt arrives POV to triage with c/o URI. Pt was diagnosed with bronchitis x 2 days ago when she was here for the same. Pt making wheezing sounds with voice upon auscultation of lung sounds. Pt is in NAD at this time.

## 2017-03-25 NOTE — ED Provider Notes (Signed)
[facility] Emergency Department Provider Note   ____________________________________________   First MD Initiated Contact with Patient 03/25/17 [phone]     (approximate)  I have reviewed the triage vital signs and the nursing notes.   HISTORY  Chief Complaint URI    HPI Stacey Pope is a 31 y.o. female Who comes into the hospital today with a cough. The patient reports that she has been sick for 3 weeks. She was here earlier this week because she was sick at work and was sent home. She states that she couldn't breathe and it started having a panic attack. The patient states that she has been taking the prednisone she was prescribed but is unable to afford the albuterol inhaler because she does not have insurance.the patient reports that she had a fever last week but none this week. She's had some shortness of breath some chest and back pain from coughing. She feels like an elephant is sitting on her chest and she felt this way for the past 3 weeks. She reports that she feels no different than she did when she came in previously. The patient reports that she was at work again [employer] and he started feeling short of breath again.   Past Medical History:  Diagnosis Date  . Asthma     There are no active problems to display for this patient.   History reviewed. No pertinent surgical history.  Prior to Admission medications   Medication Sig Start Date End Date Taking? Authorizing Provider  albuterol (PROVENTIL HFA;VENTOLIN HFA) 108 (90 Base) MCG/ACT inhaler Inhale 2 puffs into the lungs every 6 (six) hours as needed for wheezing or shortness of breath. 05/09/16   Rebecka Apley, MD  albuterol (PROVENTIL HFA;VENTOLIN HFA) 108 (90 Base) MCG/ACT inhaler Inhale 2 puffs into the lungs every 6 (six) hours as needed for wheezing or shortness of breath. 03/22/17   Darci Current, MD  azithromycin (ZITHROMAX Z-PAK) 250 MG tablet Take 2 tablets (500 mg) on  Day  1,  followed by 1 tablet (250 mg) once daily on Days 2 through 5. 03/25/17 03/30/17  Rebecka Apley, MD  chlorpheniramine-HYDROcodone (TUSSIONEX PENNKINETIC ER) 10-8 MG/5ML SUER Take 5 mLs by mouth 2 (two) times daily. 10/19/15   Joni Reining, PA-C  cyclobenzaprine (FLEXERIL) 5 MG tablet Take 1 tablet (5 mg total) by mouth 3 (three) times daily as needed for muscle spasms. 09/13/16   Menshew, Charlesetta Ivory, PA-C  HYDROcodone-acetaminophen (NORCO) 5-325 MG tablet Take 1 tablet by mouth 3 (three) times daily as needed for severe pain. 09/13/16   Menshew, Charlesetta Ivory, PA-C  ibuprofen (ADVIL,MOTRIN) 800 MG tablet Take 1 tablet (800 mg total) by mouth every 8 (eight) hours as needed for moderate pain (with food). 05/18/15   Rockne Menghini, MD  ketorolac (TORADOL) 10 MG tablet Take 1 tablet (10 mg total) by mouth every 8 (eight) hours. 09/13/16   Menshew, Charlesetta Ivory, PA-C  methylPREDNISolone (MEDROL DOSEPAK) 4 MG TBPK tablet Take Tapered dose as directed 10/11/15   Joni Reining, PA-C  metroNIDAZOLE (FLAGYL) 500 MG tablet Take 1 tablet (500 mg total) by mouth 2 (two) times daily. 05/25/15   Governor Rooks, MD  predniSONE (DELTASONE) 20 MG tablet Take 3 tablets (60 mg total) by mouth daily. 05/09/16   Rebecka Apley, MD  predniSONE (DELTASONE) 20 MG tablet Take 3 tablets (60 mg total) by mouth daily. 03/22/17 03/27/17  Darci Current, MD  sulfamethoxazole-trimethoprim (  BACTRIM DS,SEPTRA DS) 800-160 MG tablet Take 1 tablet by mouth 2 (two) times daily. 03/29/16   Triplett, Rulon Eisenmenger B, FNP    Allergies Augmentin [amoxicillin-pot clavulanate]  No family history on file.  Social History Social History  Substance Use Topics  . Smoking status: Former Games developer  . Smokeless tobacco: Never Used  . Alcohol use No    Review of Systems  Constitutional:  fever/chills Eyes: No visual changes. ENT: No sore throat. Cardiovascular:  chest pain. Respiratory: cough and shortness of  breath. Gastrointestinal: No abdominal pain.  No nausea, no vomiting.  No diarrhea.  No constipation. Genitourinary: Negative for dysuria. Musculoskeletal:  back pain. Skin: Negative for rash. Neurological: Negative for headaches, focal weakness or numbness.   ____________________________________________   PHYSICAL EXAM:  VITAL SIGNS: ED Triage Vitals  Enc Vitals Group     BP 03/25/17 0227 124/80     Pulse Rate 03/25/17 0227 73     Resp 03/25/17 0227 16     Temp 03/25/17 0227 98.7 F (37.1 C)     Temp Source 03/25/17 0227 Oral     SpO2 03/25/17 0227 99 %     Weight 03/25/17 0230 230 lb (104.3 kg)     Height 03/25/17 0230  (1.651 m)     Head Circumference --      Peak Flow --      Pain Score 03/25/17 0229 10     Pain Loc --      Pain Edu? --      Excl. in GC? --     Constitutional: Alert and oriented. Well appearing and inmoderate distress. Eyes: Conjunctivae are normal. PERRL. EOMI. Head: Atraumatic. Nose: No congestion/rhinnorhea. Mouth/Throat: Mucous membranes are moist.  Oropharynx non-erythematous. Cardiovascular: Normal rate, regular rhythm. Grossly normal heart sounds.  Good peripheral circulation. Respiratory: Normal respiratory effort.  No retractions. expiratory wheezing in all lung fields active coughing Gastrointestinal: Soft and nontender. No distention. positive bowel sounds Musculoskeletal: No lower extremity tenderness nor edema.   Neurologic:  Normal speech and language.  Skin:  Skin is warm, dry and intact.  Psychiatric: Mood and affect are normal.   ____________________________________________   LABS (all labs ordered are listed, but only abnormal results are displayed)  Labs Reviewed  CBC - Abnormal; Notable for the following:       Result Value   WBC 17.7 (*)    All other components within normal limits  BASIC METABOLIC PANEL - Abnormal; Notable for the following:    Calcium 8.7 (*)    All other components within normal limits   TROPONIN I   ____________________________________________  EKG  ED ECG REPORT I, Rebecka Apley, the attending physician, personally viewed and interpreted this ECG.   Date: 03/25/2017  EKG Time: 0710  Rate: 69  Rhythm: normal sinus rhythm  Axis: normal  Intervals:right bundle branch block  ST&T Change: none  ____________________________________________  RADIOLOGY  Dg Chest 2 View  Result Date: 03/25/2017 CLINICAL DATA:  Asthma.  Wheezing. EXAM: CHEST  2 VIEW COMPARISON:  Radiographs 3 days prior 03/22/2017 FINDINGS: The cardiomediastinal contours are normal. Mild bronchial thickening. Pulmonary vasculature is normal. No consolidation, pleural effusion, or pneumothorax. No acute osseous abnormalities are seen. IMPRESSION: Mild bronchial thickening suggesting asthma or bronchitis. Electronically Signed   By: Rubye Oaks M.D.   On: 03/25/2017 03:14    ____________________________________________   PROCEDURES  Procedure(s) performed: None  Procedures  Critical Care performed: No  ____________________________________________   INITIAL IMPRESSION /  ASSESSMENT AND PLAN / ED COURSE  Pertinent labs & imaging results that were available during my care of the patient were reviewed by me and considered in my medical decision making (see chart for details).  This is a 31 year old female who comes into the hospital today after being sick for the past 3 weeks. The patient has had a cough and some wheezing. She has asthma but has been unable to afford her albuterol inhaler. Given the duration of the patient's cough I will give her some Solu-Medrol some magnesium sulfate another DuoNeb as well as some azithromycin. I will reassess the patient.     after receiving the breathing treatment, Solu-Medrol and magnesium sulfate the patient states that she does feel improved. I did order an inhaler from pharmacy for the patient to receive 2 puffs of the inhaler. I will discharge  the patient with this inhaler as well as some azithromycin given the duration of her symptoms. The patient will be discharged home and encouraged to follow up with her primary care physician. The patient has no further complaints or concerns. ____________________________________________   FINAL CLINICAL IMPRESSION(S) / ED DIAGNOSES  Final diagnoses:  Bronchitis  Cough      NEW MEDICATIONS STARTED DURING THIS VISIT:  New Prescriptions   AZITHROMYCIN (ZITHROMAX Z-PAK) 250 MG TABLET    Take 2 tablets (500 mg) on  Day 1,  followed by 1 tablet (250 mg) once daily on Days 2 through 5.     Note:  This document was prepared using Dragon voice recognition software and may include unintentional dictation errors.    Rebecka Apley, MD 03/25/17 [phone]

## 2017-03-25 NOTE — Discharge Instructions (Signed)
Please use your inhalor 2 puffs every 4-6 hours. Please follow up with your primary care physician. Please continue your steroids.

## 2017-10-27 IMAGING — CT CT ABD-PELV W/ CM
1 of 2 series · 15 of 32 positions shown, 19 images · IV contrast (omnipaque)
Comparison: Pelvic sonogram from earlier today. 03/24/2013 chest CT
angiogram. 12/13/2006 abdominal sonogram.

CLINICAL DATA: 21-year-old female with recent miscarriage presents
with lower abdominal pain, nausea and vomiting. Leukocytosis.

EXAM:
CT ABDOMEN AND PELVIS WITH CONTRAST
TECHNIQUE: Multidetector CT imaging of the abdomen and pelvis was performed
using the standard protocol following bolus administration of
intravenous contrast.
CONTRAST:  100mL OMNIPAQUE IOHEXOL 350 MG/ML SOLN

[Series 2: routine abd pel with · axial · 0.97mm/px · z∈[-842,-382]mm · 15 of 102 slices shown, 19 images]
[im 5/102  soft-tissue]
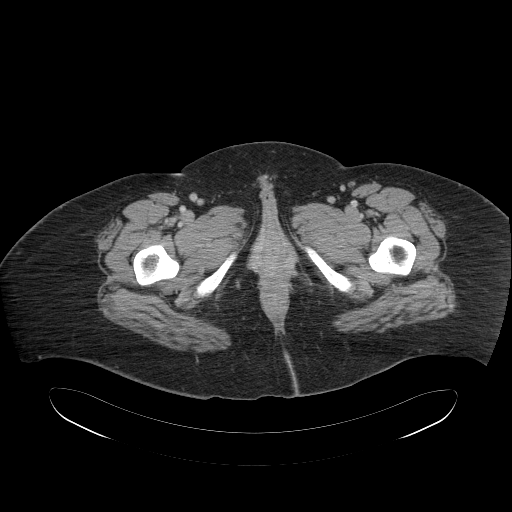
[im 5/102  bone]
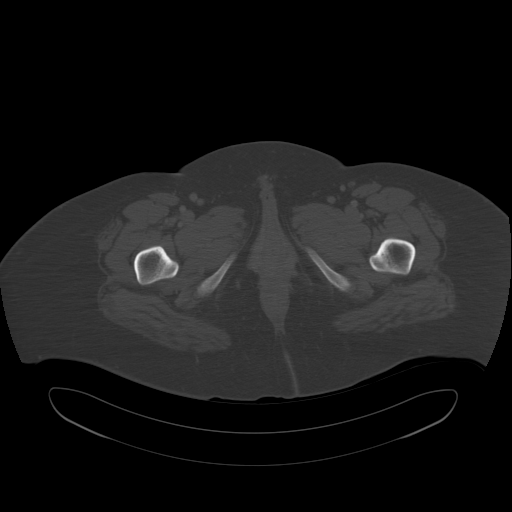
[im 13/102  soft-tissue]
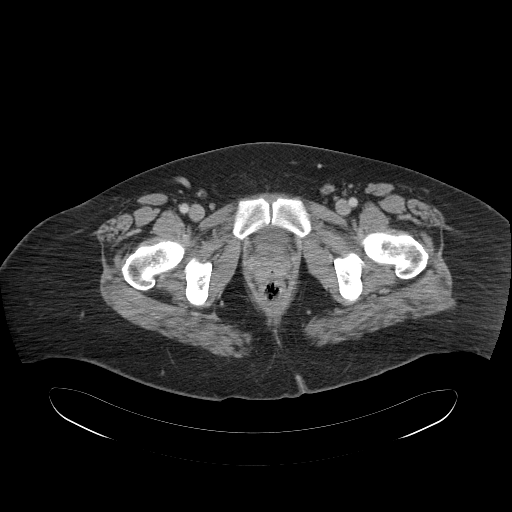
[im 21/102  soft-tissue]
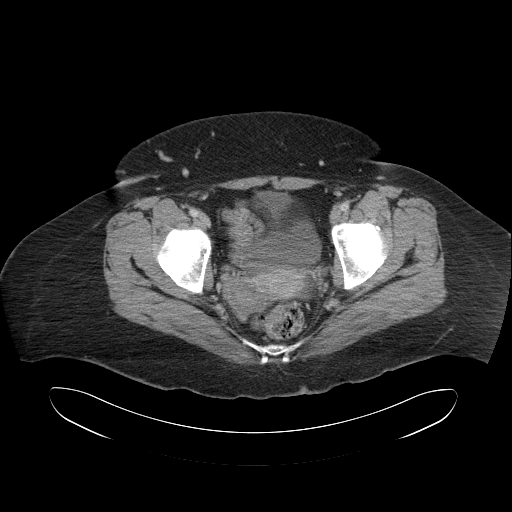
[im 29/102  soft-tissue]
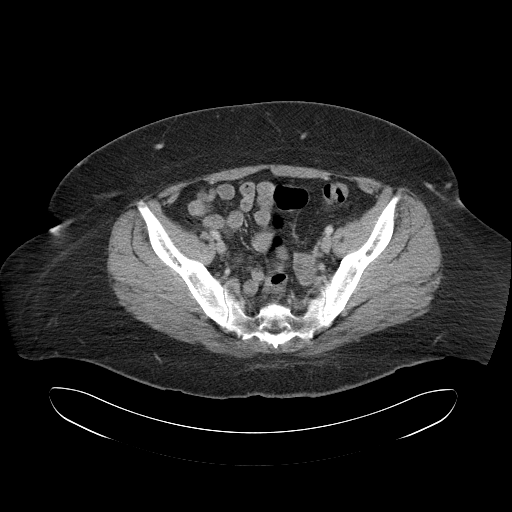
[im 37/102  soft-tissue]
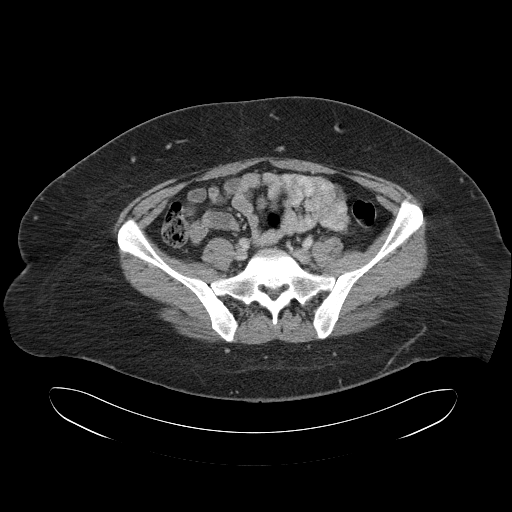
[im 45/102  soft-tissue]
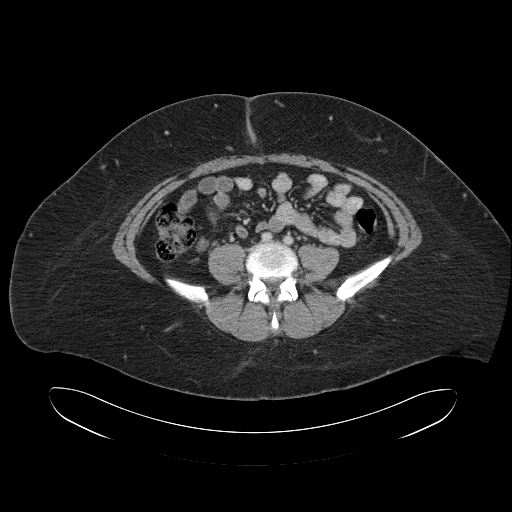
[im 53/102  soft-tissue]
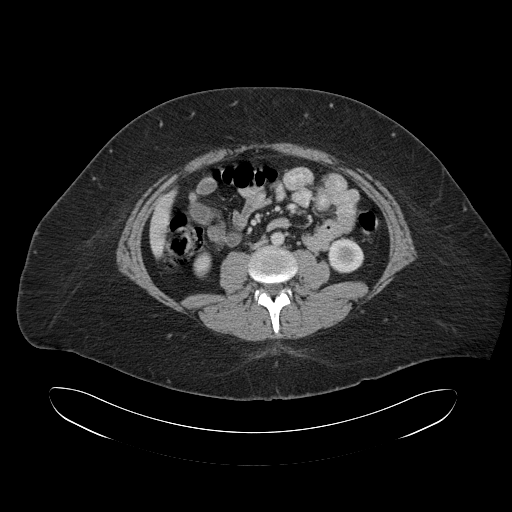
[im 57/102  soft-tissue]
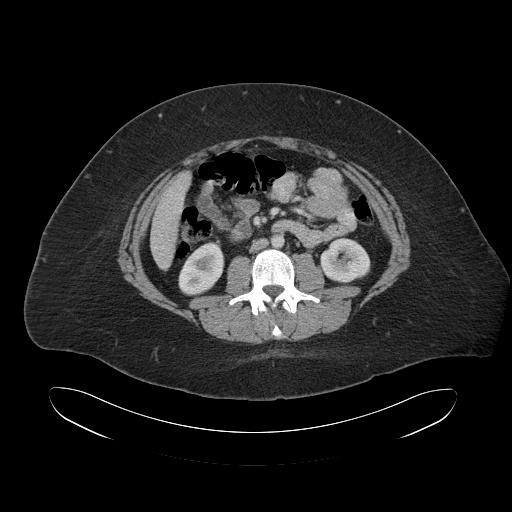
[im 65/102  soft-tissue]
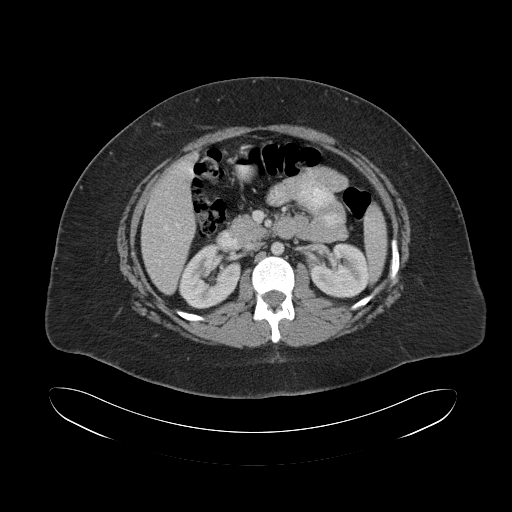
[im 65/102  bone]
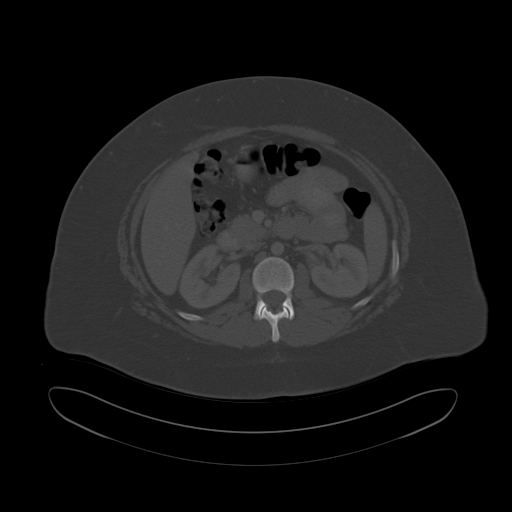
[im 73/102  soft-tissue]
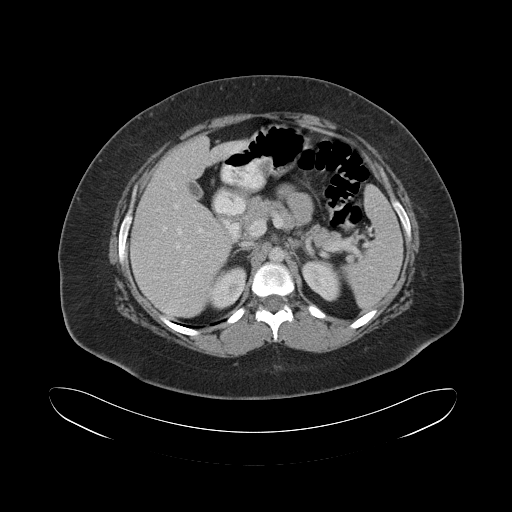
[im 81/102  soft-tissue]
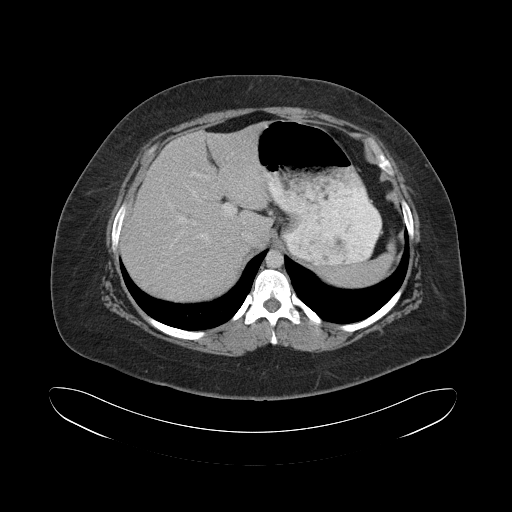
[im 85/102  lung]
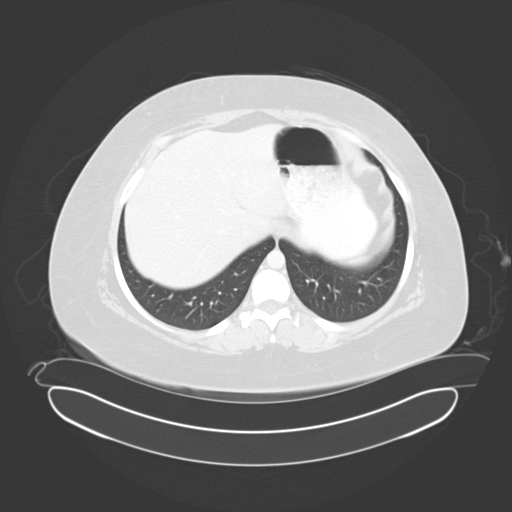
[im 89/102  soft-tissue]
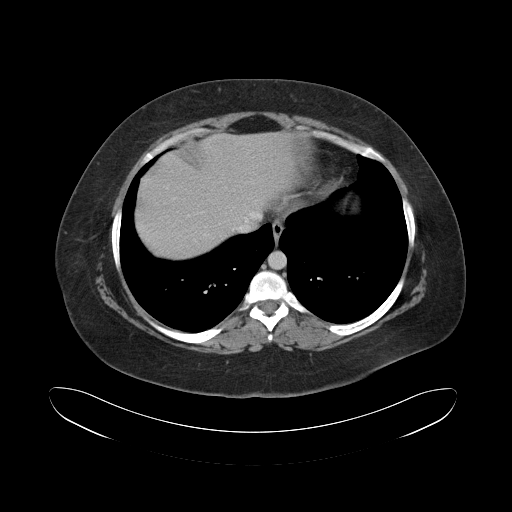
[im 89/102  lung]
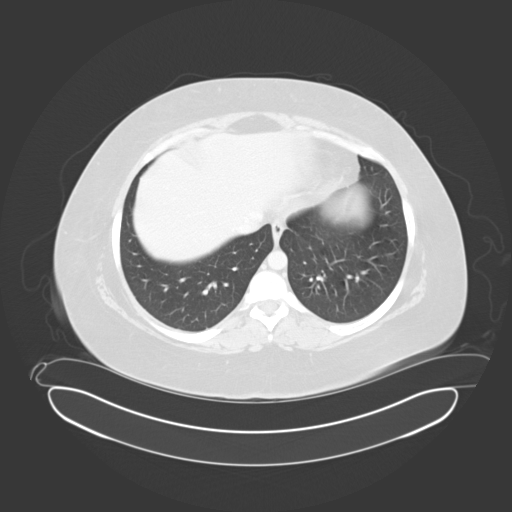
[im 93/102  lung]
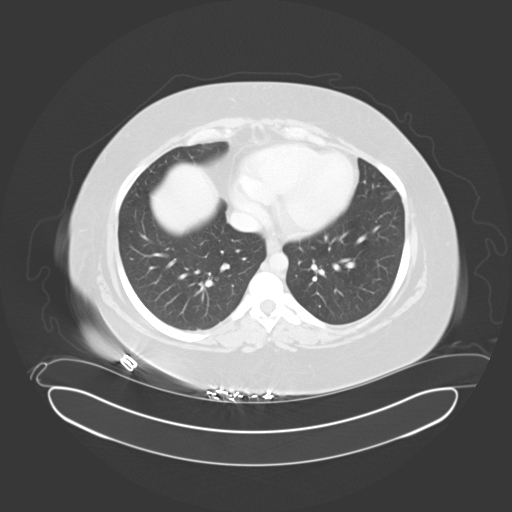
[im 97/102  soft-tissue]
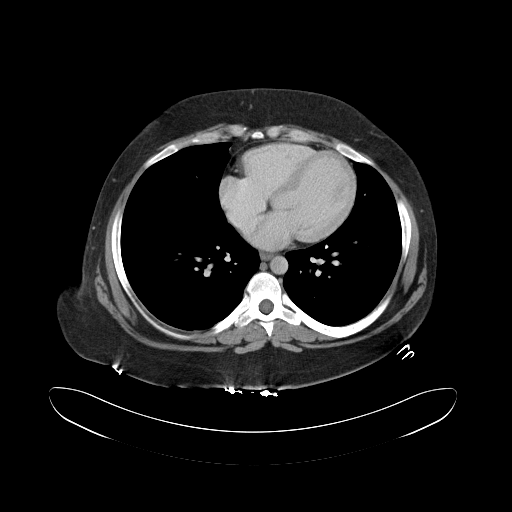
[im 97/102  lung]
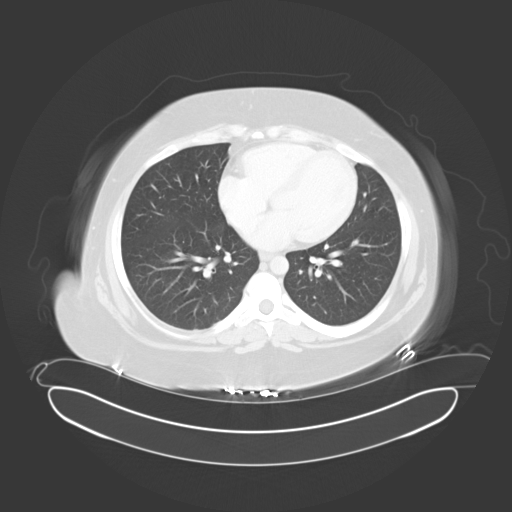

[15 of 32 positions shown; findings below may reference images not displayed]

FINDINGS: Lower chest: No significant pulmonary nodules or acute consolidative
airspace disease.

Hepatobiliary: Normal liver with no liver mass. Normal gallbladder
with no radiopaque cholelithiasis. No biliary ductal dilatation.

Pancreas: Normal, with no mass or duct dilation.

Spleen: Normal size. No mass.

Adrenals/Urinary Tract: Normal adrenals. Normal kidneys with no
hydronephrosis and no renal mass. Normal bladder.

Stomach/Bowel: Grossly normal stomach. Normal caliber small bowel
with no small bowel wall thickening. Normal appendix. Normal large
bowel with no diverticulosis, large bowel wall thickening or
pericolonic fat stranding.

Vascular/Lymphatic: Normal caliber abdominal aorta. Patent portal,
splenic, hepatic and renal veins. No pathologically enlarged lymph
nodes in the abdomen or pelvis.

Reproductive: Grossly normal uterus. Ovaries appear symmetric and
within normal limits. No adnexal mass.

Other: No pneumoperitoneum, ascites or focal fluid collection.

Musculoskeletal: No aggressive appearing focal osseous lesions.
Stable sclerotic focus in the T12 vertebral body, in keeping with a
benign bone island.
IMPRESSION: No acute abnormality. No evidence of bowel obstruction or acute
bowel inflammation. Normal appendix. No hydronephrosis.

## 2018-06-01 ENCOUNTER — Other Ambulatory Visit: Payer: Self-pay

## 2018-06-01 ENCOUNTER — Emergency Department: Payer: Self-pay

## 2018-06-01 ENCOUNTER — Encounter: Payer: Self-pay | Admitting: Emergency Medicine

## 2018-06-01 ENCOUNTER — Inpatient Hospital Stay
Admission: EM | Admit: 2018-06-01 | Discharge: 2018-06-02 | DRG: 203 | Disposition: A | Discharge status: Home / Self Care | Payer: Self-pay | Attending: Internal Medicine | Admitting: Internal Medicine

## 2018-06-01 DIAGNOSIS — J069 Acute upper respiratory infection, unspecified: Secondary | ICD-10-CM

## 2018-06-01 DIAGNOSIS — R062 Wheezing: Secondary | ICD-10-CM

## 2018-06-01 DIAGNOSIS — J4541 Moderate persistent asthma with (acute) exacerbation: Principal | ICD-10-CM | POA: Diagnosis present

## 2018-06-01 DIAGNOSIS — J45901 Unspecified asthma with (acute) exacerbation: Secondary | ICD-10-CM | POA: Diagnosis present

## 2018-06-01 DIAGNOSIS — J0100 Acute maxillary sinusitis, unspecified: Secondary | ICD-10-CM | POA: Diagnosis present

## 2018-06-01 DIAGNOSIS — Z87891 Personal history of nicotine dependence: Secondary | ICD-10-CM

## 2018-06-01 LAB — BASIC METABOLIC PANEL
Anion gap: 12 (ref 5–15)
BUN: 14 mg/dL (ref 6–20)
CALCIUM: 8.8 mg/dL — AB (ref 8.9–10.3)
CHLORIDE: 106 mmol/L (ref 98–111)
CO2: 18 mmol/L — ABNORMAL LOW (ref 22–32)
CREATININE: 0.74 mg/dL (ref 0.44–1.00)
GFR calc non Af Amer: >60 mL/min (ref >60)
Glucose, Bld: 190 mg/dL — ABNORMAL HIGH (ref 70–99)
Potassium: 3.8 mmol/L (ref 3.5–5.1)
Sodium: 136 mmol/L (ref 135–145)

## 2018-06-01 LAB — CBC
HCT: 42 % (ref 36.0–46.0)
HEMOGLOBIN: 13.7 g/dL (ref 12.0–15.0)
MCH: 30.3 pg (ref 26.0–34.0)
MCHC: 32.6 g/dL (ref 30.0–36.0)
MCV: 92.9 fL (ref 80.0–100.0)
Platelets: 278 10*3/uL (ref 150–400)
RBC: 4.52 MIL/uL (ref 3.87–5.11)
RDW: 13.7 % (ref 11.5–15.5)
WBC: 14.2 10*3/uL — AB (ref 4.0–10.5)
nRBC: 0 % (ref 0.0–0.2)

## 2018-06-01 MED ORDER — METHYLPREDNISOLONE SODIUM SUCC 125 MG IJ SOLR
125.0000 mg | Freq: Once | INTRAMUSCULAR | Status: AC
  Administered 2018-06-01: 125 mg via INTRAMUSCULAR
  Filled 2018-06-01: qty 2

## 2018-06-01 MED ORDER — MAGNESIUM SULFATE 2 GM/50ML IV SOLN
2.0000 g | Freq: Once | INTRAVENOUS | Status: AC
  Administered 2018-06-01: 2 g via INTRAVENOUS
  Filled 2018-06-01: qty 50

## 2018-06-01 MED ORDER — IPRATROPIUM-ALBUTEROL 0.5-2.5 (3) MG/3ML IN SOLN
RESPIRATORY_TRACT | Status: AC
  Filled 2018-06-01: qty 3

## 2018-06-01 MED ORDER — IPRATROPIUM-ALBUTEROL 0.5-2.5 (3) MG/3ML IN SOLN
3.0000 mL | Freq: Once | RESPIRATORY_TRACT | Status: AC
  Administered 2018-06-01: 3 mL via RESPIRATORY_TRACT
  Filled 2018-06-01: qty 3

## 2018-06-01 MED ORDER — IPRATROPIUM-ALBUTEROL 0.5-2.5 (3) MG/3ML IN SOLN
3.0000 mL | Freq: Once | RESPIRATORY_TRACT | Status: AC
  Administered 2018-06-01: 3 mL via RESPIRATORY_TRACT

## 2018-06-01 NOTE — ED Triage Notes (Signed)
Congestion with green sinus drainage x 1 month.

## 2018-06-01 NOTE — ED Notes (Signed)
Pt states congestion and cough that has been ongoing for several weeks. Pt states productive cough with green sputum. Expiratory wheezing noted during assessment.

## 2018-06-01 NOTE — ED Notes (Signed)
Report received 

## 2018-06-01 NOTE — ED Provider Notes (Signed)
[facility] EMERGENCY DEPARTMENT Provider Note   CSN: [id number] Arrival date & time: 06/01/18  1619     History   Chief Complaint Chief Complaint  Patient presents with  . Nasal Congestion    HPI Stacey Pope is a 32 y.o. female.  Presents to the emergency department for evaluation of cough and congestion x1 month.  She denies any fevers.  She has a history of asthma and has had increasing shortness of breath with wheezing.  Please have little relief with home albuterol inhaler as well as nebs.  Cough is slightly productive.  She is also had significant facial pain and pressure along the maxillary sinus.  She denies any nausea vomiting diarrhea or abdominal pain.  No rashes.  Tolerating p.o. well.  HPI  Past Medical History:  Diagnosis Date  . Asthma     Patient Active Problem List   Diagnosis Date Noted  . Acute asthma exacerbation 06/01/2018    History reviewed. No pertinent surgical history.   OB History    Gravida  1   Para      Term      Preterm      AB      Living        SAB      TAB      Ectopic      Multiple      Live Births               Home Medications    Prior to Admission medications   Not on File    Family History No family history on file.  Social History Social History   Tobacco Use  . Smoking status: Former Games developer  . Smokeless tobacco: Never Used  Substance Use Topics  . Alcohol use: No  . Drug use: No     Allergies   Augmentin [amoxicillin-pot clavulanate]   Review of Systems Review of Systems  Constitutional: Negative for fever.  HENT: Positive for congestion, rhinorrhea, sinus pressure and sinus pain. Negative for ear discharge, sore throat, trouble swallowing and voice change.   Respiratory: Positive for cough, chest tightness and wheezing. Negative for shortness of breath and stridor.   Cardiovascular: Negative for chest pain.  Gastrointestinal: Negative for abdominal pain,  diarrhea, nausea and vomiting.  Genitourinary: Negative for dysuria, flank pain and pelvic pain.  Musculoskeletal: Positive for myalgias. Negative for back pain.  Skin: Negative for rash.  Neurological: Negative for dizziness and headaches.     Physical Exam Updated Vital Signs BP (!) 110/49 (BP Location: Right Arm)   Pulse 85   Temp 97.8 F (36.6 C)   Resp 18   Ht 5\' 6"  (1.676 m)   Wt 104.3 kg   LMP 05/09/2018 (Within Days)   SpO2 99%   BMI 37.12 kg/m   Physical Exam  Constitutional: She is oriented to person, place, and time. She appears well-developed and well-nourished. No distress.  HENT:  Head: Normocephalic and atraumatic.  Right Ear: Hearing, tympanic membrane, external ear and ear canal normal.  Left Ear: Hearing, tympanic membrane, external ear and ear canal normal.  Nose: Rhinorrhea present.  Mouth/Throat: Mucous membranes are normal. No trismus in the jaw. No uvula swelling. Posterior oropharyngeal erythema present. No oropharyngeal exudate, posterior oropharyngeal edema or tonsillar abscesses. No tonsillar exudate.  Eyes: Conjunctivae are normal. Right eye exhibits no discharge. Left eye exhibits no discharge.  Neck: Normal range of motion.  Cardiovascular: Normal rate and regular rhythm.  Pulmonary/Chest: Effort normal. No stridor. No respiratory distress. She has wheezes. She has no rales. She exhibits no tenderness.  Abdominal: Soft. She exhibits no distension. There is no tenderness.  Musculoskeletal: Normal range of motion. She exhibits no deformity.  Lymphadenopathy:    She has cervical adenopathy.  Neurological: She is alert and oriented to person, place, and time. She has normal reflexes.  Skin: Skin is warm and dry.  Psychiatric: She has a normal mood and affect. Her behavior is normal. Thought content normal.     ED Treatments / Results  Labs (all labs ordered are listed, but only abnormal results are displayed) Labs Reviewed  CBC - Abnormal;  Notable for the following components:      Result Value   WBC 14.2 (*)    All other components within normal limits  BASIC METABOLIC PANEL - Abnormal; Notable for the following components:   CO2 18 (*)    Glucose, Bld 190 (*)    Calcium 8.8 (*)    All other components within normal limits    EKG None  Radiology Dg Chest 2 View  Result Date: 06/01/2018 CLINICAL DATA:  Wheezing and shortness of breath. Chest pressure and productive cough. EXAM: CHEST - 2 VIEW COMPARISON:  03/25/2017 FINDINGS: The heart size and mediastinal contours are within normal limits. Both lungs are clear. The visualized skeletal structures are unremarkable. IMPRESSION: No active cardiopulmonary disease. Electronically Signed   By: Gaylyn RongWalter  Liebkemann M.D.   On: 06/01/2018 17:36    Procedures Procedures (including critical care time)  Medications Ordered in ED Medications  ipratropium-albuterol (DUONEB) 0.5-2.5 (3) MG/3ML nebulizer solution 3 mL (3 mLs Nebulization Given 06/01/18 1712)  methylPREDNISolone sodium succinate (SOLU-MEDROL) 125 mg/2 mL injection 125 mg (125 mg Intramuscular Given 06/01/18 1712)  ipratropium-albuterol (DUONEB) 0.5-2.5 (3) MG/3ML nebulizer solution 3 mL (3 mLs Nebulization Given 06/01/18 1740)  ipratropium-albuterol (DUONEB) 0.5-2.5 (3) MG/3ML nebulizer solution 3 mL (3 mLs Nebulization Given 06/01/18 1837)  magnesium sulfate IVPB 2 g 50 mL (0 g Intravenous Stopped 06/01/18 2043)  ipratropium-albuterol (DUONEB) 0.5-2.5 (3) MG/3ML nebulizer solution 3 mL (3 mLs Nebulization Given 06/01/18 2128)     Initial Impression / Assessment and Plan / ED Course  I have reviewed the triage vital signs and the nursing notes.  Pertinent labs & imaging results that were available during my care of the patient were reviewed by me and considered in my medical decision making (see chart for details).     32 year old female with a history of asthma presents with acute nonrecurrent maxillary  sinusitis, cough and significant wheezing and chest tightness.  Patient given a total of 4 DuoNeb treatments, Solu-Medrol, magnesium sulfate.  She seen improvement with chest tightness but continues to have significant wheezing on exam.  Her vital signs are stable.  Chest x-ray shows no acute cardiopulmonary process or infiltrates.  Discussed case with hospitalist, they will come evaluate patient for admission. 23:27 care transferred to Dr. Dolores FrameSung.   Final Clinical Impressions(s) / ED Diagnoses   Final diagnoses:  Viral upper respiratory tract infection  Moderate persistent asthma with exacerbation  Wheezing  Acute non-recurrent maxillary sinusitis    ED Discharge Orders    None       Ronnette JuniperGaines, Thomas C, PA-C 06/01/18 2330    Jene EveryKinner, Robert, MD 06/02/18 1714

## 2018-06-01 NOTE — ED Notes (Signed)
Pt is in no acute distress. Wheezes heard through out. O2 sats are 98% on room air. Pt is a smoker with a hx of asthma and a productive cough. Pt has been working sick all week and taking muccinex without drinking appropriate amount of water. Pt reports not feeling better after breathing treatments. Explained to pt that she will not feel well for several days until she rests and drinks plenty of fluids. Pt asked if she would definitely be admitted. Explained to pt that she can discuss it with the admitting MD and she may be able to go home.

## 2018-06-02 MED ORDER — LEVOFLOXACIN 750 MG PO TABS
750.0000 mg | ORAL_TABLET | Freq: Once | ORAL | Status: AC
  Administered 2018-06-02: 750 mg via ORAL
  Filled 2018-06-02: qty 1

## 2018-06-02 MED ORDER — GUAIFENESIN-CODEINE 100-10 MG/5ML PO SOLN
ORAL | Status: AC
  Filled 2018-06-02: qty 5

## 2018-06-02 MED ORDER — GUAIFENESIN-CODEINE 100-10 MG/5ML PO SOLN
5.0000 mL | Freq: Once | ORAL | Status: AC
  Administered 2018-06-02: 5 mL via ORAL

## 2018-06-02 MED ORDER — PREDNISONE 20 MG PO TABS: ORAL_TABLET | ORAL | 0 refills | Status: DC

## 2018-06-02 MED ORDER — LEVOFLOXACIN 750 MG PO TABS: 750.0000 mg | ORAL_TABLET | Freq: Every day | ORAL | 0 refills | Status: DC

## 2018-06-02 MED ORDER — GUAIFENESIN-CODEINE 100-10 MG/5ML PO SOLN: 5.0000 mL | Freq: Three times a day (TID) | ORAL | 0 refills | Status: DC | PRN

## 2018-06-02 NOTE — Discharge Instructions (Addendum)
1.  Take antibiotic as prescribed (Levaquin 750 mg daily x6 days). 2.  Take steroid as prescribed (Prednisone 60 mg daily x4 days). 3.  You may take cough medicine as needed (Robitussin with codeine). 4.  Return to the ER for worsening symptoms, persistent vomiting, difficulty breathing or other concerns.

## 2018-06-02 NOTE — ED Provider Notes (Signed)
-----------------------------------------   1:06 AM on 06/02/2018 -----------------------------------------  Patient was evaluated by hospitalist Dr. Thora LanceMeier.  She had a long discussion with the patient regarding smoking cessation.  Does not feel patient warrants inpatient hospitalization.  Patient agrees because she is noninsured and concerned about her finances.  Recommends I discharge patient home on Levaquin, prednisone and Robitussin with codeine.  Tendinitis precautions given to patient as she will be on Fluoroquinolone as well as steroid.  Strict return precautions given.  Patient verbalizes understanding and agrees with plan of care.   Irean HongSung, Jade J, MD 06/02/18 [phone]

## 2018-06-02 NOTE — ED Notes (Signed)
Report given to Dr Thora LanceMeier

## 2018-06-02 NOTE — ED Notes (Signed)
Pt is comfortable going home on PO antibiotics and cough medication. No distress at this time.

## 2018-08-03 ENCOUNTER — Emergency Department: Payer: Self-pay

## 2018-08-03 ENCOUNTER — Other Ambulatory Visit: Payer: Self-pay

## 2018-08-03 DIAGNOSIS — J209 Acute bronchitis, unspecified: Secondary | ICD-10-CM | POA: Insufficient documentation

## 2018-08-03 DIAGNOSIS — J4521 Mild intermittent asthma with (acute) exacerbation: Secondary | ICD-10-CM | POA: Insufficient documentation

## 2018-08-03 DIAGNOSIS — Z87891 Personal history of nicotine dependence: Secondary | ICD-10-CM | POA: Insufficient documentation

## 2018-08-03 DIAGNOSIS — Z79899 Other long term (current) drug therapy: Secondary | ICD-10-CM | POA: Insufficient documentation

## 2018-08-03 NOTE — ED Triage Notes (Signed)
Patient reports diagnosed in November with URI and has never gotten any better.  Reports short of breath, cough and chest discomfort with deep breathing and cough.

## 2018-08-04 ENCOUNTER — Emergency Department
Admission: EM | Admit: 2018-08-04 | Discharge: 2018-08-04 | Disposition: A | Discharge status: Home / Self Care | Payer: Self-pay | Attending: Emergency Medicine | Admitting: Emergency Medicine

## 2018-08-04 ENCOUNTER — Other Ambulatory Visit: Payer: Self-pay

## 2018-08-04 DIAGNOSIS — J40 Bronchitis, not specified as acute or chronic: Secondary | ICD-10-CM

## 2018-08-04 DIAGNOSIS — J4521 Mild intermittent asthma with (acute) exacerbation: Secondary | ICD-10-CM

## 2018-08-04 LAB — CBC WITH DIFFERENTIAL/PLATELET
Abs Immature Granulocytes: 0.01 10*3/uL (ref 0.00–0.07)
Basophils Absolute: 0 10*3/uL (ref 0.0–0.1)
Basophils Relative: 0 %
EOS ABS: 0.1 10*3/uL (ref 0.0–0.5)
Eosinophils Relative: 2 %
HCT: 39.8 % (ref 36.0–46.0)
Hemoglobin: 12.9 g/dL (ref 12.0–15.0)
IMMATURE GRANULOCYTES: 0 %
LYMPHS ABS: 1.3 10*3/uL (ref 0.7–4.0)
Lymphocytes Relative: 25 %
MCH: 30.2 pg (ref 26.0–34.0)
MCHC: 32.4 g/dL (ref 30.0–36.0)
MCV: 93.2 fL (ref 80.0–100.0)
Monocytes Absolute: 0.4 10*3/uL (ref 0.1–1.0)
Monocytes Relative: 7 %
Neutro Abs: 3.4 10*3/uL (ref 1.7–7.7)
Neutrophils Relative %: 66 %
Platelets: 202 10*3/uL (ref 150–400)
RBC: 4.27 MIL/uL (ref 3.87–5.11)
RDW: 13.5 % (ref 11.5–15.5)
WBC: 5.2 10*3/uL (ref 4.0–10.5)
nRBC: 0 % (ref 0.0–0.2)

## 2018-08-04 LAB — COMPREHENSIVE METABOLIC PANEL
ALT: 15 U/L (ref 0–44)
AST: 17 U/L (ref 15–41)
Albumin: 3.8 g/dL (ref 3.5–5.0)
Alkaline Phosphatase: 67 U/L (ref 38–126)
Anion gap: 6 (ref 5–15)
BUN: 13 mg/dL (ref 6–20)
CO2: 23 mmol/L (ref 22–32)
Calcium: 8.5 mg/dL — ABNORMAL LOW (ref 8.9–10.3)
Chloride: 108 mmol/L (ref 98–111)
Creatinine, Ser: 0.56 mg/dL (ref 0.44–1.00)
GFR calc Af Amer: >60 mL/min (ref >60)
GFR calc non Af Amer: >60 mL/min (ref >60)
Glucose, Bld: 136 mg/dL — ABNORMAL HIGH (ref 70–99)
Potassium: 3.4 mmol/L — ABNORMAL LOW (ref 3.5–5.1)
Sodium: 137 mmol/L (ref 135–145)
Total Bilirubin: 0.3 mg/dL (ref 0.3–1.2)
Total Protein: 6.5 g/dL (ref 6.5–8.1)

## 2018-08-04 LAB — TROPONIN I: Troponin I: <0.03 ng/mL (ref <0.03)

## 2018-08-04 LAB — FIBRIN DERIVATIVES D-DIMER (ARMC ONLY): Fibrin derivatives D-dimer (ARMC): 407.16 ng/mL (FEU) (ref 0.00–499.00)

## 2018-08-04 MED ORDER — IPRATROPIUM-ALBUTEROL 0.5-2.5 (3) MG/3ML IN SOLN
3.0000 mL | Freq: Once | RESPIRATORY_TRACT | Status: AC
  Administered 2018-08-04: 3 mL via RESPIRATORY_TRACT
  Filled 2018-08-04: qty 3

## 2018-08-04 MED ORDER — HYDROCOD POLST-CPM POLST ER 10-8 MG/5ML PO SUER
5.0000 mL | Freq: Once | ORAL | Status: AC
  Administered 2018-08-04: 5 mL via ORAL
  Filled 2018-08-04: qty 5

## 2018-08-04 MED ORDER — METHYLPREDNISOLONE SODIUM SUCC 125 MG IJ SOLR
125.0000 mg | Freq: Once | INTRAMUSCULAR | Status: AC
  Administered 2018-08-04: 125 mg via INTRAVENOUS
  Filled 2018-08-04: qty 2

## 2018-08-04 MED ORDER — HYDROCOD POLST-CPM POLST ER 10-8 MG/5ML PO SUER: 5.0000 mL | Freq: Two times a day (BID) | ORAL | 0 refills | Status: DC

## 2018-08-04 MED ORDER — PREDNISONE 20 MG PO TABS: ORAL_TABLET | ORAL | 0 refills | Status: DC

## 2018-08-04 MED ORDER — SODIUM CHLORIDE 0.9 % IV BOLUS
1000.0000 mL | Freq: Once | INTRAVENOUS | Status: AC
  Administered 2018-08-04: 1000 mL via INTRAVENOUS

## 2018-08-04 NOTE — Discharge Instructions (Addendum)
1.  Take Prednisone 60 mg daily x4 days. 2.  You may take cough medicine as needed (Tussionex). 3.  Continue albuterol nebulizer every 4 hours as needed for wheezing/difficulty breathing. 4.  Return to the ER for worsening symptoms, persistent vomiting, difficulty breathing or other concerns.

## 2018-08-04 NOTE — ED Provider Notes (Signed)
[facility] Emergency Department Provider Note   ____________________________________________   First MD Initiated Contact with Patient 08/04/18 [phone]     (approximate)  I have reviewed the triage vital signs and the nursing notes.   HISTORY  Chief Complaint Cough and Shortness of Breath    HPI Stacey Pope is a 33 y.o. female who presents to the ED from home with a chief complaint of cough and shortness of breath.  Patient has a history of asthma and was last seen in the ED in November and diagnosed with URI.  States cough got a little better but worsened over the past several days.  Also endorses subjective fever and chills.  Complains of nausea and back pain on coughing.  Denies abdominal pain, vomiting, dysuria, diarrhea.   Denies recent travel or trauma.   Past Medical History:  Diagnosis Date  . Asthma     Patient Active Problem List   Diagnosis Date Noted  . Acute asthma exacerbation 06/01/2018    No past surgical history on file.  Prior to Admission medications   Medication Sig Start Date End Date Taking? Authorizing Provider  albuterol (PROVENTIL HFA;VENTOLIN HFA) 108 (90 Base) MCG/ACT inhaler Inhale into the lungs. 05/09/16   [provider]  chlorpheniramine-HYDROcodone (TUSSIONEX PENNKINETIC ER) 10-8 MG/5ML SUER Take 5 mLs by mouth 2 (two) times daily. 08/04/18   Irean Hong, MD  guaiFENesin-codeine 100-10 MG/5ML syrup Take 5 mLs by mouth 3 (three) times daily as needed for cough. 06/02/18   Irean Hong, MD  levofloxacin (LEVAQUIN) 750 MG tablet Take 1 tablet (750 mg total) by mouth daily. 06/02/18   Irean Hong, MD  predniSONE (DELTASONE) 20 MG tablet 3 tablets PO qd x 4 days 08/04/18   Irean Hong, MD    Allergies Augmentin [amoxicillin-pot clavulanate]  No family history on file.  Social History Social History   Tobacco Use  . Smoking status: Former Games developer  . Smokeless tobacco: Never Used  Substance Use Topics   . Alcohol use: No  . Drug use: No    Review of Systems  Constitutional: No fever/chills Eyes: No visual changes. ENT: No sore throat. Cardiovascular: Denies chest pain. Respiratory: Positive for cough and shortness of breath. Gastrointestinal: No abdominal pain.  No nausea, no vomiting.  No diarrhea.  No constipation. Genitourinary: Negative for dysuria. Musculoskeletal: Negative for back pain. Skin: Negative for rash. Neurological: Negative for headaches, focal weakness or numbness.   ____________________________________________   PHYSICAL EXAM:  VITAL SIGNS: ED Triage Vitals  Enc Vitals Group     BP 08/03/18 2243 (!) 167/93     Pulse Rate 08/03/18 2243 79     Resp 08/03/18 2243 18     Temp 08/03/18 2243 98.3 F (36.8 C)     Temp Source 08/03/18 2243 Oral     SpO2 08/03/18 2243 97 %     Weight 08/03/18 2244 230 lb (104.3 kg)     Height 08/03/18 2244 5\' 6"  (1.676 m)     Head Circumference --      Peak Flow --      Pain Score 08/03/18 2243 10     Pain Loc --      Pain Edu? --      Excl. in GC? --     Constitutional: Alert and oriented. Well appearing and in no acute distress. Eyes: Conjunctivae are normal. PERRL. EOMI. Head: Atraumatic. Nose: No congestion/rhinnorhea. Mouth/Throat: Mucous membranes are moist.  Oropharynx  non-erythematous. Neck: No stridor.   Cardiovascular: Normal rate, regular rhythm. Grossly normal heart sounds.  Good peripheral circulation. Respiratory: Normal respiratory effort.  No retractions. Lungs diminished bibasilarly, otherwise CTAB. Gastrointestinal: Soft and nontender. No distention. No abdominal bruits. No CVA tenderness. Musculoskeletal: No lower extremity tenderness nor edema.  No joint effusions. Neurologic:  Normal speech and language. No gross focal neurologic deficits are appreciated. No gait instability. Skin:  Skin is warm, dry and intact. No rash noted. Psychiatric: Mood and affect are normal. Speech and behavior are  normal.  ____________________________________________   LABS (all labs ordered are listed, but only abnormal results are displayed)  Labs Reviewed  COMPREHENSIVE METABOLIC PANEL - Abnormal; Notable for the following components:      Result Value   Potassium 3.4 (*)    Glucose, Bld 136 (*)    Calcium 8.5 (*)    All other components within normal limits  CBC WITH DIFFERENTIAL/PLATELET  FIBRIN DERIVATIVES D-DIMER ([facility] ONLY)  TROPONIN I   ____________________________________________  EKG  ED ECG REPORT I, Shayann Garbutt J, the attending physician, personally viewed and interpreted this ECG.   Date: 08/04/2018  EKG Time: 0057  Rate: 75  Rhythm: normal EKG, normal sinus rhythm  Axis: Normal  Intervals:none  ST&T Change: Nonspecific  ____________________________________________  RADIOLOGY  ED MD interpretation: No pneumonia  Official radiology report(s): Dg Chest 2 View  Result Date: 08/03/2018 CLINICAL DATA:  Shortness of breath, cough and chest discomfort. EXAM: CHEST - 2 VIEW COMPARISON:  06/01/2018 FINDINGS: Cardiomediastinal silhouette is normal. Mediastinal contours appear intact. There is no evidence of focal airspace consolidation, pleural effusion or pneumothorax. Osseous structures are without acute abnormality. Soft tissues are grossly normal. IMPRESSION: No active cardiopulmonary disease. Electronically Signed   By: Ted Mcalpine M.D.   On: 08/03/2018 23:19    ____________________________________________   PROCEDURES  Procedure(s) performed: None  Procedures  Critical Care performed: No  ____________________________________________   INITIAL IMPRESSION / ASSESSMENT AND PLAN / ED COURSE  As part of my medical decision making, I reviewed the following data within the electronic MEDICAL RECORD NUMBER Nursing notes reviewed and incorporated, Labs reviewed, EKG interpreted, Old chart reviewed, Radiograph reviewed and Notes from prior ED visits      33 year old female with asthma who presents with persistent cough since November which has worsened this week. Differential includes, but is not limited to, viral syndrome, bronchitis including COPD exacerbation, pneumonia, reactive airway disease including asthma, CHF including exacerbation with or without pulmonary/interstitial edema, pneumothorax, ACS, thoracic trauma, and pulmonary embolism.  X-ray negative for pneumonia.  Will obtain lab work including d-dimer, initiate IV Solu-Medrol, DuoNeb and reassess.   Clinical Course as of Aug 05 223  Wynelle Link Aug 04, 2018  0223 Aeration improved. Patient feeling significantly better. Will discharge home on Prednisone burst, Tussionex. Patient has Albuterol nebulizer at home. Will follow up closely with his PCP. Strict return precautions given. Patient verbalizes understanding and agrees with plan of care.   [JS]    Clinical Course User Index [JS] Irean Hong, MD     ____________________________________________   FINAL CLINICAL IMPRESSION(S) / ED DIAGNOSES  Final diagnoses:  Mild intermittent asthma with acute exacerbation  Bronchitis     ED Discharge Orders         Ordered    predniSONE (DELTASONE) 20 MG tablet     08/04/18 0225    chlorpheniramine-HYDROcodone (TUSSIONEX PENNKINETIC ER) 10-8 MG/5ML SUER  2 times daily     08/04/18 0225  Note:  This document was prepared using Dragon voice recognition software and may include unintentional dictation errors.    Irean HongSung, Shimon Trowbridge J, MD 08/04/18 [phone]

## 2018-11-05 DIAGNOSIS — M5441 Lumbago with sciatica, right side: Secondary | ICD-10-CM | POA: Insufficient documentation

## 2018-11-05 DIAGNOSIS — F418 Other specified anxiety disorders: Secondary | ICD-10-CM | POA: Insufficient documentation

## 2018-11-05 DIAGNOSIS — Z131 Encounter for screening for diabetes mellitus: Secondary | ICD-10-CM | POA: Insufficient documentation

## 2018-11-05 DIAGNOSIS — Z7689 Persons encountering health services in other specified circumstances: Secondary | ICD-10-CM | POA: Insufficient documentation

## 2018-12-12 DIAGNOSIS — M5416 Radiculopathy, lumbar region: Secondary | ICD-10-CM | POA: Insufficient documentation

## 2019-03-14 ENCOUNTER — Other Ambulatory Visit: Payer: Self-pay

## 2019-03-14 ENCOUNTER — Ambulatory Visit (INDEPENDENT_AMBULATORY_CARE_PROVIDER_SITE_OTHER): Payer: 59 | Admitting: Podiatry

## 2019-03-14 ENCOUNTER — Encounter: Payer: Self-pay | Admitting: Podiatry

## 2019-03-14 VITALS — BP 156/96 | HR 82 | Resp 16

## 2019-03-14 DIAGNOSIS — L600 Ingrowing nail: Secondary | ICD-10-CM | POA: Diagnosis not present

## 2019-03-14 DIAGNOSIS — L608 Other nail disorders: Secondary | ICD-10-CM | POA: Diagnosis not present

## 2019-03-14 MED ORDER — GENTAMICIN SULFATE 0.1 % EX CREA: 1.0000 "application " | TOPICAL_CREAM | Freq: Two times a day (BID) | CUTANEOUS | 1 refills | Status: DC

## 2019-03-14 NOTE — Patient Instructions (Signed)

## 2019-03-17 NOTE — Progress Notes (Signed)
   Subjective: Patient presents today for evaluation of pain to the bilateral great toenails that began about 6 months ago. She reports associated thickening, elongated and discoloration of the nails. She states she cannot trim her own nails. Wearing shoes increases the pain. She has not had any treatment for the symptoms. Patient presents today for further treatment and evaluation.  Past Medical History:  Diagnosis Date  . Asthma     Objective: Physical Exam General: The patient is alert and oriented x3 in no acute distress.  Dermatology: Pincer nail noted to the bilateral great toes. Skin is cool, dry and supple bilateral lower extremities. Negative for open lesions or macerations.  Vascular: Palpable pedal pulses bilaterally. No edema or erythema noted. Capillary refill within normal limits.  Neurological: Epicritic and protective threshold grossly intact bilaterally.   Musculoskeletal Exam: All pedal and ankle joints range of motion within normal limits bilateral. Muscle strength 5/5 in all groups bilateral.    Assesement: #1 Pincer nails bilateral great toes  Plan of Care:  1. Patient evaluated.  2. Discussed treatment alternatives and plan of care. Explained nail avulsion procedure and post procedure course to patient. 3. Patient opted for permanent total nail avulsion of the bilateral great toes.  4. Prior to procedure, local anesthesia infiltration utilized using 3 ml of a 50:50 mixture of 2% plain lidocaine and 0.5% plain marcaine in a normal hallux block fashion and a betadine prep performed.  5. Total permanent nail avulsions with chemical matrixectomy performed using [id number] applications of phenol followed by alcohol flush.  6. Light dressing applied. 7. Prescription for Gentamicin cream provided to patient to use daily with a bandage.  8. Return to clinic in 2 weeks.  Edrick Kins, DPM [facility]  Dr. Edrick Kins, [address]                                         [place], [place] [address]                Office [phone]  Fax [phone]

## 2019-03-21 ENCOUNTER — Telehealth: Payer: Self-pay | Admitting: Podiatry

## 2019-03-21 ENCOUNTER — Telehealth: Payer: Self-pay

## 2019-03-21 MED ORDER — DOXYCYCLINE HYCLATE 100 MG PO TABS: 100.0000 mg | ORAL_TABLET | Freq: Two times a day (BID) | ORAL | 0 refills | Status: DC

## 2019-03-21 NOTE — Telephone Encounter (Signed)
Called Patient in regards to infected toenails from procedure done on 03/14/2019; left VM for patient to continue to use Gentamicin cream after soaking and take anti-biotic (doxycycline) that was sent over to pharmacy until next visit on Tuesday (03/25/2019).

## 2019-03-21 NOTE — Telephone Encounter (Signed)
Pt states both toes she had toenail procedure on are infected and oozing green pus. Wants to know what she can do until she comes in Tuesday for her appointment.

## 2019-03-25 ENCOUNTER — Encounter: Payer: Self-pay | Admitting: Podiatry

## 2019-03-25 ENCOUNTER — Other Ambulatory Visit: Payer: Self-pay

## 2019-03-25 ENCOUNTER — Ambulatory Visit (INDEPENDENT_AMBULATORY_CARE_PROVIDER_SITE_OTHER): Payer: Self-pay | Admitting: Podiatry

## 2019-03-25 DIAGNOSIS — L608 Other nail disorders: Secondary | ICD-10-CM

## 2019-03-25 DIAGNOSIS — L600 Ingrowing nail: Secondary | ICD-10-CM

## 2019-03-25 MED ORDER — GENTAMICIN SULFATE 0.1 % EX CREA: 1.0000 "application " | TOPICAL_CREAM | Freq: Two times a day (BID) | CUTANEOUS | 1 refills | Status: DC

## 2019-03-28 ENCOUNTER — Ambulatory Visit: Payer: 59 | Admitting: Podiatry

## 2019-03-28 NOTE — Progress Notes (Signed)
   Subjective: 33 y.o. female presenting today status post total permanent nail avulsion procedure of the bilateral great toes that was performed on 03/14/2019. She reports continued soreness of the toes. Touching the areas increase the pain. She has been using the Gentamicin cream as directed. Patient is here for further evaluation and treatment.    Past Medical History:  Diagnosis Date  . Asthma      Objective: Skin is warm, dry and supple. Nail bed and respective nail fold appears to be healing appropriately. Open wound to the associated nail fold with a granular wound base and moderate amount of fibrotic tissue. Minimal drainage noted. Mild erythema around the periungual region likely due to phenol chemical matricectomy.  Assessment: #1 postop permanent total nail avulsion bilateral great toes  Plan of care: #1 patient was evaluated  #2 light debridement of open wound was performed to the periungual border of the respective toe using a currette and tissue nipper. Antibiotic ointment and Band-Aid was applied. #3 patient is to return to clinic on a PRN  basis.   Edrick Kins, DPM [facility]  Dr. Edrick Kins, DPM    [address], [place] [address]                Office [phone]  Fax [phone]

## 2019-06-18 ENCOUNTER — Other Ambulatory Visit: Payer: Self-pay

## 2019-06-18 DIAGNOSIS — Z20822 Contact with and (suspected) exposure to covid-19: Secondary | ICD-10-CM

## 2019-06-20 ENCOUNTER — Telehealth: Payer: Self-pay | Admitting: *Deleted

## 2019-06-20 LAB — NOVEL CORONAVIRUS, NAA: SARS-CoV-2, NAA: NOT DETECTED

## 2019-06-20 NOTE — Telephone Encounter (Signed)
Patient called ,requesting results are faxed to  [employer] attention [place] fax # [phone] cb # [phone]

## 2019-07-17 ENCOUNTER — Ambulatory Visit: Payer: Self-pay | Attending: Internal Medicine

## 2019-07-17 DIAGNOSIS — Z20822 Contact with and (suspected) exposure to covid-19: Secondary | ICD-10-CM | POA: Insufficient documentation

## 2019-07-19 LAB — NOVEL CORONAVIRUS, NAA: SARS-CoV-2, NAA: NOT DETECTED

## 2020-01-15 ENCOUNTER — Encounter: Payer: Self-pay | Admitting: *Deleted

## 2020-01-15 ENCOUNTER — Other Ambulatory Visit: Payer: Self-pay

## 2020-01-15 DIAGNOSIS — Z87891 Personal history of nicotine dependence: Secondary | ICD-10-CM | POA: Insufficient documentation

## 2020-01-15 DIAGNOSIS — J029 Acute pharyngitis, unspecified: Secondary | ICD-10-CM | POA: Insufficient documentation

## 2020-01-15 DIAGNOSIS — H66002 Acute suppurative otitis media without spontaneous rupture of ear drum, left ear: Secondary | ICD-10-CM | POA: Insufficient documentation

## 2020-01-15 DIAGNOSIS — J45909 Unspecified asthma, uncomplicated: Secondary | ICD-10-CM | POA: Insufficient documentation

## 2020-01-15 NOTE — ED Triage Notes (Signed)
Pt c/o L ear pain x 2 days, sore throat and headache not relieved by OTC meds.

## 2020-01-16 ENCOUNTER — Emergency Department
Admission: EM | Admit: 2020-01-16 | Discharge: 2020-01-16 | Disposition: A | Discharge status: Home / Self Care | Payer: Self-pay | Attending: Emergency Medicine | Admitting: Emergency Medicine

## 2020-01-16 DIAGNOSIS — H66002 Acute suppurative otitis media without spontaneous rupture of ear drum, left ear: Secondary | ICD-10-CM

## 2020-01-16 MED ORDER — DEXAMETHASONE 4 MG PO TABS
10.0000 mg | ORAL_TABLET | Freq: Once | ORAL | Status: AC
  Administered 2020-01-16: 10 mg via ORAL
  Filled 2020-01-16: qty 2.5

## 2020-01-16 MED ORDER — IBUPROFEN 600 MG PO TABS: 600.0000 mg | ORAL_TABLET | Freq: Three times a day (TID) | ORAL | 0 refills | Status: DC | PRN

## 2020-01-16 MED ORDER — AZITHROMYCIN 250 MG PO TABS: ORAL_TABLET | ORAL | 0 refills | Status: DC

## 2020-01-16 MED ORDER — FLUTICASONE PROPIONATE 50 MCG/ACT NA SUSP: 2.0000 | Freq: Every day | NASAL | 1 refills | Status: DC

## 2020-01-16 NOTE — ED Notes (Signed)
Pt in with co left earache since last weekend, radiates to left face. Rates it 10/10 hx of ear aches as a child.

## 2020-01-16 NOTE — ED Notes (Signed)
Patient discharged to home per MD order. Patient in stable condition, and deemed medically cleared by ED provider for discharge. Discharge instructions reviewed with patient/family using "Teach Back"; verbalized understanding of medication education and administration, and information about follow-up care. Denies further concerns. ° °

## 2020-01-16 NOTE — ED Provider Notes (Signed)
[facility] Emergency Department Provider Note  ____________________________________________   First MD Initiated Contact with Patient 01/16/20 [phone]     (approximate)  I have reviewed the triage vital signs and the nursing notes.   HISTORY  Chief Complaint Otalgia    HPI Stacey Pope is a 34 y.o. female  Here with mild left ear pain. Pt reports that for the past several days, she has had nasal congestion, sore throat, and left ear pain. She states that she has had a pressure like sensation in her ear along with an aching, throbbing pain. No improvement with OTC analgesics. No known fever. No known drainage. No difficulty hearing or swallowing. She has a h/o sinus infections but denies h/o recurrent ear infections. No dizziness. No tinnitus.        Past Medical History:  Diagnosis Date  . Asthma     Patient Active Problem List   Diagnosis Date Noted  . Lumbar radiculitis 12/12/2018  . Encounter to establish care 11/05/2018  . Low back pain due to bilateral sciatica 11/05/2018  . Situational anxiety 11/05/2018  . Acute asthma exacerbation 06/01/2018  . Asthma 11/24/2013  . Environmental allergies 11/24/2013  . GERD (gastroesophageal reflux disease) 11/24/2013  . Migraine 11/24/2013    History reviewed. No pertinent surgical history.  Prior to Admission medications   Medication Sig Start Date End Date Taking? Authorizing Provider  azithromycin (ZITHROMAX Z-PAK) 250 MG tablet Take 2 tablets (500 mg) on  Day 1,  followed by 1 tablet (250 mg) once daily on Days 2 through 5. 01/16/20   Shaune Pollack, MD  doxycycline (VIBRA-TABS) 100 MG tablet Take 1 tablet (100 mg total) by mouth 2 (two) times daily. 03/21/19   Felecia Shelling, DPM  fluticasone (FLONASE) 50 MCG/ACT nasal spray Place 2 sprays into both nostrils daily. 01/16/20 02/15/20  Shaune Pollack, MD  gentamicin cream (GARAMYCIN) 0.1 % Apply 1 application topically 2 (two) times daily. 03/25/19    Felecia Shelling, DPM  ibuprofen (ADVIL) 600 MG tablet Take 1 tablet (600 mg total) by mouth every 8 (eight) hours as needed for moderate pain. 01/16/20   Shaune Pollack, MD    Allergies Augmentin [amoxicillin-pot clavulanate]  History reviewed. No pertinent family history.  Social History Social History   Tobacco Use  . Smoking status: Former Games developer  . Smokeless tobacco: Never Used  Substance Use Topics  . Alcohol use: No  . Drug use: No    Review of Systems  Review of Systems  Constitutional: Negative for fatigue and fever.  HENT: Positive for ear pain, sinus pressure and sore throat. Negative for congestion.   Eyes: Negative for visual disturbance.  Respiratory: Negative for cough and shortness of breath.   Cardiovascular: Negative for chest pain.  Gastrointestinal: Negative for abdominal pain, diarrhea, nausea and vomiting.  Genitourinary: Negative for flank pain.  Musculoskeletal: Negative for back pain and neck pain.  Skin: Negative for rash and wound.  Neurological: Negative for weakness.  All other systems reviewed and are negative.    ____________________________________________  PHYSICAL EXAM:      VITAL SIGNS: ED Triage Vitals  Enc Vitals Group     BP 01/15/20 2240 (!) 144/98     Pulse Rate 01/15/20 2240 72     Resp 01/15/20 2240 18     Temp 01/15/20 2240 98 F (36.7 C)     Temp Source 01/15/20 2240 Oral     SpO2 01/15/20 2240 98 %  Weight 01/15/20 2243 250 lb (113.4 kg)     Height 01/15/20 2243 5\' 6"  (1.676 m)     Head Circumference --      Peak Flow --      Pain Score 01/15/20 2242 8     Pain Loc --      Pain Edu? --      Excl. in GC? --      Physical Exam Vitals and nursing note reviewed.  Constitutional:      General: She is not in acute distress.    Appearance: She is well-developed.  HENT:     Head: Normocephalic and atraumatic.     Ears:     Comments: Moderate posterior pharyngeal erythema without tonsillar swelling or exudates.  Left TM erythematous and opaque, with slight bulging. No perforation. EACs normal. No mastoid tenderness or erythema. Eyes:     Conjunctiva/sclera: Conjunctivae normal.  Cardiovascular:     Rate and Rhythm: Normal rate and regular rhythm.     Heart sounds: Normal heart sounds. No murmur heard.  No friction rub.  Pulmonary:     Effort: Pulmonary effort is normal. No respiratory distress.     Breath sounds: Normal breath sounds. No wheezing or rales.  Abdominal:     General: There is no distension.     Palpations: Abdomen is soft.     Tenderness: There is no abdominal tenderness.  Musculoskeletal:     Cervical back: Neck supple.  Skin:    General: Skin is warm.     Capillary Refill: Capillary refill takes less than 2 seconds.  Neurological:     Mental Status: She is alert and oriented to person, place, and time.     Motor: No abnormal muscle tone.       ____________________________________________   LABS (all labs ordered are listed, but only abnormal results are displayed)  Labs Reviewed - No data to display  ____________________________________________  EKG: None ________________________________________  RADIOLOGY All imaging, including plain films, CT scans, and ultrasounds, independently reviewed by me, and interpretations confirmed via formal radiology reads.  ED MD interpretation:   None  Official radiology report(s): No results found.  ____________________________________________  PROCEDURES   Procedure(s) performed (including Critical Care):  Procedures  ____________________________________________  INITIAL IMPRESSION / MDM / ASSESSMENT AND PLAN / ED COURSE  As part of my medical decision making, I reviewed the following data within the electronic MEDICAL RECORD NUMBER Nursing notes reviewed and incorporated, Old chart reviewed, Notes from prior ED visits, and [place] Controlled Substance Database       *ABRISH ERNY was evaluated in Emergency  Department on 01/16/2020 for the symptoms described in the history of present illness. She was evaluated in the context of the global COVID-19 pandemic, which necessitated consideration that the patient might be at risk for infection with the SARS-CoV-2 virus that causes COVID-19. Institutional protocols and algorithms that pertain to the evaluation of patients at risk for COVID-19 are in a state of rapid change based on information released by regulatory bodies including the CDC and federal and state organizations. These policies and algorithms were followed during the patient's care in the ED.  Some ED evaluations and interventions may be delayed as a result of limited staffing during the pandemic.*     Medical Decision Making:  34 yo F here with likely AOM of left ear, possibly in setting of recent sinusitis. She is afebrile and well appearing. No clinical signs of mastoiditis or systemic infection. She is  allergic to penicillin so will tx with azithro, nasal steroids. Return precautions given.   ____________________________________________  FINAL CLINICAL IMPRESSION(S) / ED DIAGNOSES  Final diagnoses:  Non-recurrent acute suppurative otitis media of left ear without spontaneous rupture of tympanic membrane     MEDICATIONS GIVEN DURING THIS VISIT:  Medications  dexamethasone (DECADRON) tablet 10 mg (10 mg Oral Given 01/16/20 0514)     ED Discharge Orders         Ordered    azithromycin (ZITHROMAX Z-PAK) 250 MG tablet     Discontinue  Reprint     01/16/20 0438    fluticasone (FLONASE) 50 MCG/ACT nasal spray  Daily     Discontinue  Reprint     01/16/20 0438    ibuprofen (ADVIL) 600 MG tablet  Every 8 hours PRN     Discontinue  Reprint     01/16/20 0438           Note:  This document was prepared using Dragon voice recognition software and may include unintentional dictation errors.   Shaune Pollack, MD 01/16/20 [phone]

## 2020-08-08 ENCOUNTER — Emergency Department
Admission: EM | Admit: 2020-08-08 | Discharge: 2020-08-09 | Disposition: A | Discharge status: Home / Self Care | Payer: Self-pay | Attending: Emergency Medicine | Admitting: Emergency Medicine

## 2020-08-08 ENCOUNTER — Encounter: Payer: Self-pay | Admitting: Emergency Medicine

## 2020-08-08 ENCOUNTER — Other Ambulatory Visit: Payer: Self-pay

## 2020-08-08 DIAGNOSIS — K130 Diseases of lips: Secondary | ICD-10-CM | POA: Insufficient documentation

## 2020-08-08 DIAGNOSIS — R0602 Shortness of breath: Secondary | ICD-10-CM | POA: Insufficient documentation

## 2020-08-08 DIAGNOSIS — J45909 Unspecified asthma, uncomplicated: Secondary | ICD-10-CM | POA: Insufficient documentation

## 2020-08-08 DIAGNOSIS — T7840XA Allergy, unspecified, initial encounter: Secondary | ICD-10-CM | POA: Insufficient documentation

## 2020-08-08 DIAGNOSIS — F17290 Nicotine dependence, other tobacco product, uncomplicated: Secondary | ICD-10-CM | POA: Insufficient documentation

## 2020-08-08 HISTORY — DX: Other intervertebral disc displacement, lumbar region: M51.26

## 2020-08-08 MED ORDER — IPRATROPIUM-ALBUTEROL 0.5-2.5 (3) MG/3ML IN SOLN
3.0000 mL | Freq: Once | RESPIRATORY_TRACT | Status: AC
  Administered 2020-08-08: 3 mL via RESPIRATORY_TRACT
  Filled 2020-08-08: qty 3

## 2020-08-08 MED ORDER — EPINEPHRINE 0.3 MG/0.3ML IJ SOAJ
0.3000 mg | Freq: Once | INTRAMUSCULAR | Status: AC
  Administered 2020-08-08: 0.3 mg via INTRAMUSCULAR
  Filled 2020-08-08: qty 0.3

## 2020-08-08 MED ORDER — FAMOTIDINE IN NACL 20-0.9 MG/50ML-% IV SOLN
20.0000 mg | Freq: Once | INTRAVENOUS | Status: AC
  Administered 2020-08-08: 20 mg via INTRAVENOUS
  Filled 2020-08-08: qty 50

## 2020-08-08 MED ORDER — EPINEPHRINE 0.3 MG/0.3ML IJ SOAJ: 0.3000 mg | INTRAMUSCULAR | 0 refills | Status: DC | PRN

## 2020-08-08 MED ORDER — PREDNISONE 20 MG PO TABS: 40.0000 mg | ORAL_TABLET | Freq: Every day | ORAL | 0 refills | Status: AC

## 2020-08-08 MED ORDER — METHYLPREDNISOLONE SODIUM SUCC 125 MG IJ SOLR
125.0000 mg | Freq: Once | INTRAMUSCULAR | Status: AC
  Administered 2020-08-08: 125 mg via INTRAVENOUS
  Filled 2020-08-08: qty 2

## 2020-08-08 NOTE — ED Provider Notes (Signed)
[facility] Emergency Department Provider Note    ____________________________________________   I have reviewed the triage vital signs and the nursing notes.   HISTORY  Chief Complaint Allergic Reaction   History limited by: Not Limited   HPI Stacey Pope is a 35 y.o. female who presents to the emergency department today because of concerns for possible allergic reaction.  Patient states that she was just relaxing at home when she started having swelling to her lips.  She also felt some shortness of breath and throat tightening. The patient took benadryl at home. The patient denies similar reaction in the past and does not have any known allergies. The patient denies any unusual ingestions.   Records reviewed. Per medical record review patient has a history of asthma.  Past Medical History:  Diagnosis Date  . Asthma   . Herniated lumbar intervertebral disc     Patient Active Problem List   Diagnosis Date Noted  . Lumbar radiculitis 12/12/2018  . Encounter to establish care 11/05/2018  . Low back pain due to bilateral sciatica 11/05/2018  . Situational anxiety 11/05/2018  . Acute asthma exacerbation 06/01/2018  . Asthma 11/24/2013  . Environmental allergies 11/24/2013  . GERD (gastroesophageal reflux disease) 11/24/2013  . Migraine 11/24/2013    History reviewed. No pertinent surgical history.  Prior to Admission medications   Medication Sig Start Date End Date Taking? Authorizing Provider  azithromycin (ZITHROMAX Z-PAK) 250 MG tablet Take 2 tablets (500 mg) on  Day 1,  followed by 1 tablet (250 mg) once daily on Days 2 through 5. 01/16/20   Shaune Pollack, MD  doxycycline (VIBRA-TABS) 100 MG tablet Take 1 tablet (100 mg total) by mouth 2 (two) times daily. 03/21/19   Felecia Shelling, DPM  fluticasone (FLONASE) 50 MCG/ACT nasal spray Place 2 sprays into both nostrils daily. 01/16/20 02/15/20  Shaune Pollack, MD  gentamicin cream (GARAMYCIN) 0.1 %  Apply 1 application topically 2 (two) times daily. 03/25/19   Felecia Shelling, DPM  ibuprofen (ADVIL) 600 MG tablet Take 1 tablet (600 mg total) by mouth every 8 (eight) hours as needed for moderate pain. 01/16/20   Shaune Pollack, MD    Allergies Augmentin [amoxicillin-pot clavulanate]  No family history on file.  Social History Social History   Tobacco Use  . Smoking status: Current Some Day Smoker    Types: Cigars  . Smokeless tobacco: Never Used  Substance Use Topics  . Alcohol use: No  . Drug use: No    Review of Systems Constitutional: No fever/chills Eyes: No visual changes. ENT: Positive for throat tightening. Cardiovascular: Denies chest pain. Respiratory: Positive for shortness of breath. Gastrointestinal: No abdominal pain.  No nausea, no vomiting.  No diarrhea.   Genitourinary: Negative for dysuria. Musculoskeletal: Negative for back pain. Skin: Negative for rash. Neurological: Negative for headaches, focal weakness or numbness.  ____________________________________________   PHYSICAL EXAM:  VITAL SIGNS: ED Triage Vitals  Enc Vitals Group     BP 08/08/20 2058 (!) 167/117     Pulse Rate 08/08/20 2058 (!) 111     Resp 08/08/20 2058 (!) 24     Temp --      Temp src --      SpO2 08/08/20 2058 100 %     Weight 08/08/20 2059 230 lb (104.3 kg)     Height 08/08/20 2059 5\' 6"  (1.676 m)     Head Circumference --      Peak Flow --  Pain Score 08/08/20 2059 0   Constitutional: Alert and oriented.  Eyes: Conjunctivae are normal.  ENT      Head: Normocephalic and atraumatic.      Nose: No congestion/rhinnorhea.      Mouth/Throat: Mucous membranes are moist.      Neck: No stridor. Hematological/Lymphatic/Immunilogical: No cervical lymphadenopathy. Cardiovascular: Normal rate, regular rhythm.  No murmurs, rubs, or gallops.  Respiratory: Diffuse expiratory wheezing. Gastrointestinal: Soft and non tender. No rebound. No guarding.  Genitourinary:  Deferred Musculoskeletal: Normal range of motion in all extremities. No lower extremity edema. Neurologic:  Normal speech and language. No gross focal neurologic deficits are appreciated.  Skin:  Skin is warm, dry and intact. No rash noted. Psychiatric: Mood and affect are normal. Speech and behavior are normal. Patient exhibits appropriate insight and judgment.  ____________________________________________    LABS (pertinent positives/negatives)  None  ____________________________________________   EKG  None  ____________________________________________    RADIOLOGY  None  ____________________________________________   PROCEDURES  Procedures  ____________________________________________   INITIAL IMPRESSION / ASSESSMENT AND PLAN / ED COURSE  Pertinent labs & imaging results that were available during my care of the patient were reviewed by me and considered in my medical decision making (see chart for details).   Patient presented to the emergency department today after apparent allergic reaction.  Patient is complaining of some throat tightening and shortness of breath.  She does have a history of asthma and on exam has diffuse expiratory wheezing.  Patient took Benadryl at home.  Patient was initially given epinephrine, Pepcid and Solu-Medrol here in the emergency department.  Shortly after the administration of the strokes the patient stated she still did not feel any improvement and started developing a rash on her chest.  Did discuss with patient.  Did discuss that the steroids would take a couple of hours to truly kick in.  However given that the patient continues to have some shortness of breath and now developing rash will give second dose of epinephrine. Discussed with patient that we will want to observe her for a number of hours. ____________________________________________   FINAL CLINICAL IMPRESSION(S) / ED DIAGNOSES  Final diagnoses:  Allergic reaction,  initial encounter     Note: This dictation was prepared with Dragon dictation. Any transcriptional errors that result from this process are unintentional     Phineas Semen, MD 08/08/20 2321

## 2020-08-08 NOTE — Discharge Instructions (Signed)
Please seek medical attention for any high fevers, chest pain, shortness of breath, change in behavior, persistent vomiting, bloody stool or any other new or concerning symptoms.  

## 2020-08-08 NOTE — ED Triage Notes (Signed)
Patient with lip swelling that started about an hour ago and shortness of breath that started about 30 minutes ago.

## 2020-08-09 ENCOUNTER — Emergency Department: Payer: Self-pay

## 2020-08-09 ENCOUNTER — Encounter: Payer: Self-pay | Admitting: Radiology

## 2020-08-09 MED ORDER — IPRATROPIUM-ALBUTEROL 0.5-2.5 (3) MG/3ML IN SOLN
3.0000 mL | Freq: Once | RESPIRATORY_TRACT | Status: AC
  Administered 2020-08-09: 3 mL via RESPIRATORY_TRACT
  Filled 2020-08-09: qty 3

## 2020-08-09 MED ORDER — LORAZEPAM 1 MG PO TABS
1.0000 mg | ORAL_TABLET | Freq: Once | ORAL | Status: AC
  Administered 2020-08-09: 1 mg via ORAL
  Filled 2020-08-09: qty 1

## 2020-08-09 MED ORDER — DIPHENHYDRAMINE HCL 50 MG/ML IJ SOLN: 50.0000 mg | Freq: Once | INTRAMUSCULAR | Status: DC

## 2020-08-09 MED ORDER — ALBUTEROL SULFATE (2.5 MG/3ML) 0.083% IN NEBU
10.0000 mg | INHALATION_SOLUTION | Freq: Once | RESPIRATORY_TRACT | Status: AC
  Administered 2020-08-09: 10 mg via RESPIRATORY_TRACT
  Filled 2020-08-09: qty 12

## 2020-08-09 NOTE — ED Provider Notes (Signed)
CXR done as patient continued to complain of shortness of breath for several hours after 3 duo nebs.  During the entire time she had no wheezing, she was moving good air, she had no stridor, she had no hypoxia or increased work of breathing.  With a recent diagnosis of Covid I decided to send her for chest x-ray to rule out pneumonia.  Chest x-ray was visualized by me and normal, confirmed by radiology.  Patient monitored for 5 hrs post 2nd epipen with no recurrence of symptoms. Normal WOB, normal sats, lungs CTAB with no wheezing. Rash has fully resolved. No signs of angioedema. Will dc with epipen and steroids. Recommended close f/u with PCP and discussed my standard return precautions.      Nita Sickle, MD 08/09/20 [phone]

## 2020-08-13 ENCOUNTER — Emergency Department
Admission: EM | Admit: 2020-08-13 | Discharge: 2020-08-14 | Disposition: A | Discharge status: Home / Self Care | Payer: Self-pay | Attending: Emergency Medicine | Admitting: Emergency Medicine

## 2020-08-13 ENCOUNTER — Other Ambulatory Visit: Payer: Self-pay

## 2020-08-13 DIAGNOSIS — Z7951 Long term (current) use of inhaled steroids: Secondary | ICD-10-CM | POA: Insufficient documentation

## 2020-08-13 DIAGNOSIS — F17290 Nicotine dependence, other tobacco product, uncomplicated: Secondary | ICD-10-CM | POA: Insufficient documentation

## 2020-08-13 DIAGNOSIS — J45909 Unspecified asthma, uncomplicated: Secondary | ICD-10-CM | POA: Insufficient documentation

## 2020-08-13 DIAGNOSIS — T7840XA Allergy, unspecified, initial encounter: Secondary | ICD-10-CM | POA: Insufficient documentation

## 2020-08-13 MED ORDER — METHYLPREDNISOLONE SODIUM SUCC 125 MG IJ SOLR
125.0000 mg | Freq: Once | INTRAMUSCULAR | Status: AC
  Administered 2020-08-14: 125 mg via INTRAVENOUS
  Filled 2020-08-13: qty 2

## 2020-08-13 MED ORDER — FAMOTIDINE IN NACL 20-0.9 MG/50ML-% IV SOLN
20.0000 mg | Freq: Once | INTRAVENOUS | Status: AC
  Administered 2020-08-14: 20 mg via INTRAVENOUS
  Filled 2020-08-13: qty 50

## 2020-08-13 NOTE — ED Provider Notes (Signed)
[facility] Emergency Department Provider Note  ____________________________________________   Event Date/Time   First MD Initiated Contact with Patient 08/13/20 2341     (approximate)  I have reviewed the triage vital signs and the nursing notes.   HISTORY  Chief Complaint Allergic Reaction    HPI KEMYAH BUSER is a 35 y.o. female with medical history as listed below which notably includes asthma and prior episodes of anaphylaxis.  She presents tonight by EMS from work with concerns for anaphylactic reaction.  She said that she woke up for work tonight at 9 PM and felt a scratchy throat and like it was difficult to swallow and that she had a hoarse voice.  She went ahead and went to work but her symptoms were worsening and she developed a red rash on her chest and worsening difficulty breathing and a scratchy throat.  A coworker administered her EpiPen and she took Benadryl 50 mg by mouth, as well as 2 puffs from her inhaler.  They then called 911.  She says that she feels better now but still has a scratchy throat and has a bit of a rash on her chest but no longer feels short of breath.  She is able to swallow without difficulty.  Her voice is little bit hoarse.  The symptoms were acute in onset and relatively rapidly worsened, better after epi and Benadryl.  She has no recent fever or chills but says that she was diagnosed with COVID-19 about 3 weeks ago.  She is feeling better from that.  She has no chest pain, nausea, vomiting, abdominal pain, nor diarrhea.        Past Medical History:  Diagnosis Date  . Asthma   . Herniated lumbar intervertebral disc     Patient Active Problem List   Diagnosis Date Noted  . Lumbar radiculitis 12/12/2018  . Encounter to establish care 11/05/2018  . Low back pain due to bilateral sciatica 11/05/2018  . Situational anxiety 11/05/2018  . Acute asthma exacerbation 06/01/2018  . Asthma 11/24/2013  . Environmental  allergies 11/24/2013  . GERD (gastroesophageal reflux disease) 11/24/2013  . Migraine 11/24/2013    History reviewed. No pertinent surgical history.  Prior to Admission medications   Medication Sig Start Date End Date Taking? Authorizing Provider  EPINEPHrine (EPIPEN 2-PAK) 0.3 mg/0.3 mL IJ SOAJ injection Inject 0.3 mg into the muscle once for 1 dose. Take for severe allergic reaction, then come immediately to the Emergency Department or call 911. 08/14/20 08/14/20 Yes Hinda Kehr, MD  predniSONE (DELTASONE) 10 MG tablet Take 4 tabs (40 mg) PO x 3 days, then take 2 tabs (20 mg) PO x 3 days, then take 1 tab (10 mg) PO x 3 days, then take 1/2 tab (5 mg) PO x 4 days. 08/14/20  Yes Hinda Kehr, MD  azithromycin (ZITHROMAX Z-PAK) 250 MG tablet Take 2 tablets (500 mg) on  Day 1,  followed by 1 tablet (250 mg) once daily on Days 2 through 5. 01/16/20   Duffy Bruce, MD  doxycycline (VIBRA-TABS) 100 MG tablet Take 1 tablet (100 mg total) by mouth 2 (two) times daily. 03/21/19   Edrick Kins, DPM  fluticasone (FLONASE) 50 MCG/ACT nasal spray Place 2 sprays into both nostrils daily. 01/16/20 02/15/20  Duffy Bruce, MD  gentamicin cream (GARAMYCIN) 0.1 % Apply 1 application topically 2 (two) times daily. 03/25/19   Edrick Kins, DPM  ibuprofen (ADVIL) 600 MG tablet Take 1 tablet (600 mg  total) by mouth every 8 (eight) hours as needed for moderate pain. 01/16/20   Duffy Bruce, MD    Allergies Augmentin [amoxicillin-pot clavulanate]  History reviewed. No pertinent family history.  Social History Social History   Tobacco Use  . Smoking status: Current Some Day Smoker    Types: Cigars  . Smokeless tobacco: Never Used  Substance Use Topics  . Alcohol use: No  . Drug use: No    Review of Systems Constitutional: No fever/chills Eyes: No visual changes. ENT: " Scratchy" throat, some difficulty swallowing, now improved Cardiovascular: Denies chest pain. Respiratory: Positive for shortness of  breath. Gastrointestinal: No abdominal pain.  No nausea, no vomiting.  No diarrhea.  No constipation. Genitourinary: Negative for dysuria. Musculoskeletal: Negative for neck pain.  Negative for back pain. Integumentary: Itchy red rash on her chest. Neurological: Negative for headaches, focal weakness or numbness.   ____________________________________________   PHYSICAL EXAM:  VITAL SIGNS: ED Triage Vitals  Enc Vitals Group     BP 08/13/20 2337 (!) 157/91     Pulse Rate 08/13/20 2337 91     Resp 08/13/20 2337 16     Temp 08/13/20 2337 98 F (36.7 C)     Temp Source 08/13/20 2337 Oral     SpO2 08/13/20 2337 100 %     Weight 08/13/20 2342 104.3 kg (230 lb)     Height 08/13/20 2342 1.676 m (5' 6" )     Head Circumference --      Peak Flow --      Pain Score 08/13/20 2337 0     Pain Loc --      Pain Edu? --      Excl. in [place]? --     Constitutional: Alert and oriented.  Eyes: Conjunctivae are normal.  Head: Atraumatic. Nose: No congestion/rhinnorhea. Mouth/Throat: No mucosal involvement.  No angioedema. Neck: No stridor.  Patient has a slightly hoarse voice.  No meningeal signs.   Cardiovascular: Normal rate, regular rhythm. Good peripheral circulation. Respiratory: Normal respiratory effort.  No retractions.  Mild expiratory wheezing throughout lung fields upon auscultation, similar to what was documented on previous visit. Gastrointestinal: Soft and nontender. No distention.  Musculoskeletal: No lower extremity tenderness nor edema. No gross deformities of extremities. Neurologic:  Normal speech and language. No gross focal neurologic deficits are appreciated.  Skin:  Skin is warm and dry.  She has erythema on her chest without any obvious urticaria.  No rash on her arms or face. Psychiatric: Mood and affect are normal. Speech and behavior are normal.  ____________________________________________   LABS (all labs ordered are listed, but only abnormal results are  displayed)  Labs Reviewed - No data to display ____________________________________________  EKG  No indication for emergent EKG ____________________________________________  RADIOLOGY I, Hinda Kehr, personally viewed and evaluated these images (plain radiographs) as part of my medical decision making, as well as reviewing the written report by the radiologist.  ED MD interpretation: No indication for emergent imaging  Official radiology report(s): No results found.  ____________________________________________   PROCEDURES   Procedure(s) performed (including Critical Care):  Procedures   ____________________________________________   INITIAL IMPRESSION / MDM / [place] / ED COURSE  As part of my medical decision making, I reviewed the following data within the [facility] notes reviewed and incorporated, Old chart reviewed and Notes from prior ED visits   Differential diagnosis includes, but is not limited to, allergic reaction, anaphylaxis, asthma exacerbation, sequela of COVID-19.  Based on her subjective findings of shortness of breath, her rash, and her prior history, she likely met criteria for anaphylaxis prior to coming to the ED.  Currently she is not short of breath and has no angioedema, no abdominal symptoms, normal vital signs, which is reassuring.  I would not give her any additional Benadryl.  She just completed a course of prednisone and I talked to her about the importance of not continuing indefinitely on steroids, but for tonight given the acuity of her symptoms, I am giving her a dose of Solu-Medrol 125 mg IV and anticipate putting her on a prednisone taper.  I also ordered famotidine 20 mg IV.  We will monitor for approximately 5 hours for recurrence of symptoms.  The patient understands and agrees with the plan.     Clinical Course as of 08/14/20 0447  Sat Aug 14, 2020  [id number] I have checked on the patient several  times during the night and is now about 5 hours and 15 minutes.  She says she feels quite a bit better.  Her voice seems to be returning to normal and she is having no shortness of breath.  Her rash has resolved.  She is appropriate for discharge.  I am providing prescriptions as listed below and strongly encouraged again that she follow-up with her PCP.  I gave my usual and customary return precautions. [CF]    Clinical Course User Index [CF] Hinda Kehr, MD     ____________________________________________  FINAL CLINICAL IMPRESSION(S) / ED DIAGNOSES  Final diagnoses:  Allergic reaction, initial encounter     MEDICATIONS GIVEN DURING THIS VISIT:  Medications  methylPREDNISolone sodium succinate (SOLU-MEDROL) 125 mg/2 mL injection 125 mg (125 mg Intravenous Given 08/14/20 0010)  famotidine (PEPCID) IVPB 20 mg premix (0 mg Intravenous Stopped 08/14/20 0110)     ED Discharge Orders         Ordered    EPINEPHrine (EPIPEN 2-PAK) 0.3 mg/0.3 mL IJ SOAJ injection   Once        08/14/20 0444    predniSONE (DELTASONE) 10 MG tablet        08/14/20 0444          *Please note:  DEVA RON was evaluated in Emergency Department on 08/14/2020 for the symptoms described in the history of present illness. She was evaluated in the context of the global COVID-19 pandemic, which necessitated consideration that the patient might be at risk for infection with the SARS-CoV-2 virus that causes COVID-19. Institutional protocols and algorithms that pertain to the evaluation of patients at risk for COVID-19 are in a state of rapid change based on information released by regulatory bodies including the CDC and federal and state organizations. These policies and algorithms were followed during the patient's care in the ED.  Some ED evaluations and interventions may be delayed as a result of limited staffing during and after the pandemic.*  Note:  This document was prepared using Dragon voice recognition  software and may include unintentional dictation errors.   Hinda Kehr, MD 08/14/20 [phone]

## 2020-08-13 NOTE — ED Triage Notes (Addendum)
Pt presents to ER following an allergic reaction at work.  Pt was seen here recently for allergic rxn, and this situation is similar.  Pt states she started breaking out in hives around her upper chest, and started feeling SOB, and had some throat tightness.  Pt does not know why she started breaking out and having rxn.  Pt used her EPI pen x1, and took 50 mg benadryl and 2 puffs of her albuterol inhaler.  Pt states her throat feels scratchy at this time, but she is not feeling SOB anymore.  Pt in NAD at this time.

## 2020-08-14 MED ORDER — EPINEPHRINE 0.3 MG/0.3ML IJ SOAJ: 0.3000 mg | Freq: Once | INTRAMUSCULAR | 0 refills | Status: AC

## 2020-08-14 MED ORDER — PREDNISONE 10 MG PO TABS: ORAL_TABLET | ORAL | 0 refills | Status: DC

## 2020-08-14 NOTE — Discharge Instructions (Signed)

## 2021-02-15 DIAGNOSIS — R030 Elevated blood-pressure reading, without diagnosis of hypertension: Secondary | ICD-10-CM | POA: Insufficient documentation

## 2021-02-15 DIAGNOSIS — Z889 Allergy status to unspecified drugs, medicaments and biological substances status: Secondary | ICD-10-CM | POA: Insufficient documentation

## 2021-02-22 ENCOUNTER — Other Ambulatory Visit: Payer: Self-pay

## 2021-02-22 ENCOUNTER — Encounter: Payer: Self-pay | Admitting: Podiatry

## 2021-02-22 ENCOUNTER — Ambulatory Visit: Payer: BC Managed Care – PPO | Admitting: Podiatry

## 2021-02-22 ENCOUNTER — Ambulatory Visit (INDEPENDENT_AMBULATORY_CARE_PROVIDER_SITE_OTHER): Payer: BC Managed Care – PPO

## 2021-02-22 DIAGNOSIS — S93601A Unspecified sprain of right foot, initial encounter: Secondary | ICD-10-CM

## 2021-02-22 DIAGNOSIS — M778 Other enthesopathies, not elsewhere classified: Secondary | ICD-10-CM

## 2021-02-22 MED ORDER — BETAMETHASONE SOD PHOS & ACET 6 (3-3) MG/ML IJ SUSP
3.0000 mg | Freq: Once | INTRAMUSCULAR | Status: AC
  Administered 2021-02-22: 3 mg via INTRA_ARTICULAR

## 2021-02-22 NOTE — Progress Notes (Signed)
   HPI: 35 y.o. female presenting today for new complaint regarding an injury that the patient sustained last week.  Patient states that she has a history of back problems and occasionally her right leg gives out on her.  Last week her right leg gave out at her home and she fell.  When she fell she fell on her foot.  She is unsure how she fell on her foot or the mechanism of the injury.  She states that since the injury she has not been able to apply pressure on the foot.  She has significant pain with ambulation.  She has not anything for treatment.  She presents for further treatment and evaluation  Past Medical History:  Diagnosis Date   Asthma    Herniated lumbar intervertebral disc      Physical Exam: General: The patient is alert and oriented x3 in no acute distress.  Dermatology: Skin is warm, dry and supple bilateral lower extremities. Negative for open lesions or macerations.  Vascular: Palpable pedal pulses bilaterally. No edema or erythema noted. Capillary refill within normal limits.  Neurological: Epicritic and protective threshold grossly intact bilaterally.   Musculoskeletal Exam: No pedal deformity.  Pain on palpation throughout the midtarsal joint laterally right foot  Radiographic Exam:  Normal osseous mineralization. Joint spaces preserved. No fracture/dislocation/boney destruction.  Assessment: 1.  Right foot sprain.  DOI: Second week of August 2022   Plan of Care:  1. Patient evaluated. X-Rays reviewed.  2.  Cam boot dispensed.  Weightbearing as tolerated x4 weeks 3.  Injection of 0.5 cc Celestone Soluspan injected along the lateral midtarsal joint 4.  Patient is already on meloxicam 15 mg daily.  Continue. 5.  Return to clinic in 4 weeks.  If the patient is still having pain we will do follow-up x-rays      Felecia Shelling, DPM [facility]  Dr. Felecia Shelling, DPM    [address] [place], [place]  [address]                Office [phone]  Fax [phone]

## 2021-03-02 ENCOUNTER — Telehealth: Payer: Self-pay

## 2021-03-02 NOTE — Telephone Encounter (Signed)
Patient called stated that she is still having pain in her left foot. She said she went back to work yesterday with the boot but she is having trouble sleeping.  She request something mild to help her sleep.  Please advise

## 2021-03-07 DIAGNOSIS — M79676 Pain in unspecified toe(s): Secondary | ICD-10-CM

## 2021-03-08 ENCOUNTER — Telehealth: Payer: Self-pay | Admitting: Podiatry

## 2021-03-08 NOTE — Telephone Encounter (Signed)
Thank you :)

## 2021-03-08 NOTE — Telephone Encounter (Signed)
Patient called stating she works at [employer] and their cooperate office told her she cant work with the boot she has on her foot. Per cooperate it needs to be hard surface plastic and foot has to be covered or she cannot work. Patient is scheduled for her shift at 8pm tonight so do we have something she can use that cooperate approves of. Please call pt [phone]

## 2021-03-08 NOTE — Telephone Encounter (Signed)
I returned call to patient and informed her that we do not provide a plastic shoe.  Per Dr. Logan Bores verbal order, she can go into a good supportive tennis shoe and a trilock ankle brace.  Patient has been notified and will come back office to pick up brace

## 2021-03-25 ENCOUNTER — Ambulatory Visit: Payer: BC Managed Care – PPO | Admitting: Podiatry

## 2021-03-25 ENCOUNTER — Ambulatory Visit (INDEPENDENT_AMBULATORY_CARE_PROVIDER_SITE_OTHER): Payer: BC Managed Care – PPO

## 2021-03-25 ENCOUNTER — Encounter: Payer: Self-pay | Admitting: Podiatry

## 2021-03-25 ENCOUNTER — Other Ambulatory Visit: Payer: Self-pay

## 2021-03-25 ENCOUNTER — Telehealth: Payer: Self-pay | Admitting: *Deleted

## 2021-03-25 DIAGNOSIS — S93601A Unspecified sprain of right foot, initial encounter: Secondary | ICD-10-CM

## 2021-03-25 DIAGNOSIS — M778 Other enthesopathies, not elsewhere classified: Secondary | ICD-10-CM

## 2021-03-25 MED ORDER — HYDROCODONE-ACETAMINOPHEN 5-325 MG PO TABS: 1.0000 | ORAL_TABLET | Freq: Four times a day (QID) | ORAL | 0 refills | Status: DC | PRN

## 2021-03-25 NOTE — Telephone Encounter (Signed)
I attempted to return her call.  I left her a message to call back.

## 2021-03-25 NOTE — Progress Notes (Signed)
   HPI: 35 y.o. female presenting today for new complaint regarding an injury that the patient sustained last week.  Patient states that she has a history of back problems and occasionally her right leg gives out on her.  Last week her right leg gave out at her home and she fell.  When she fell she fell on her foot.  She is unsure how she fell on her foot or the mechanism of the injury.  She states that since the injury she has not been able to apply pressure on the foot.  She has significant pain with ambulation.  She has not anything for treatment.  She presents for further treatment and evaluation  Past Medical History:  Diagnosis Date   Asthma    Herniated lumbar intervertebral disc      Physical Exam: General: The patient is alert and oriented x3 in no acute distress.  Dermatology: Skin is warm, dry and supple bilateral lower extremities. Negative for open lesions or macerations.  Vascular: Palpable pedal pulses bilaterally. No edema or erythema noted. Capillary refill within normal limits.  Neurological: Epicritic and protective threshold grossly intact bilaterally.   Musculoskeletal Exam: No pedal deformity.  Pain on palpation throughout the midtarsal joint laterally right foot  Radiographic Exam:  Normal osseous mineralization. Joint spaces preserved. No fracture/dislocation/boney destruction.  Assessment: 1.  Right foot sprain.  DOI: Second week of August 2022   Plan of Care:  1. Patient evaluated.  2.  Unfortunately the patient is not improving despite wearing the cam boot and the steroid injection.  She continues to have pain and tenderness.  She has also been taking meloxicam with no improvement. 3.  Today were going to order MRI right foot 4.  Prescription for Vicodin 5/3 2 5  mg 5.  Continue weightbearing in the cam boot 6.  Continue meloxicam 15 mg daily as per prescribing physician 7.  Return to clinic after MRI to review results and discuss further treatment  options  *Works at [employer], DPM [facility]  Dr. Felecia Shelling, DPM    [address] [place], [place] [address]                Office [phone]  Fax [phone]

## 2021-04-18 ENCOUNTER — Other Ambulatory Visit: Payer: Self-pay

## 2021-07-18 DIAGNOSIS — H9201 Otalgia, right ear: Secondary | ICD-10-CM | POA: Insufficient documentation

## 2021-07-18 DIAGNOSIS — R7989 Other specified abnormal findings of blood chemistry: Secondary | ICD-10-CM | POA: Insufficient documentation

## 2021-07-18 DIAGNOSIS — F419 Anxiety disorder, unspecified: Secondary | ICD-10-CM | POA: Insufficient documentation

## 2021-07-18 DIAGNOSIS — E559 Vitamin D deficiency, unspecified: Secondary | ICD-10-CM | POA: Insufficient documentation

## 2021-07-18 DIAGNOSIS — Z720 Tobacco use: Secondary | ICD-10-CM | POA: Insufficient documentation

## 2021-07-18 DIAGNOSIS — F32A Depression, unspecified: Secondary | ICD-10-CM | POA: Insufficient documentation

## 2021-10-03 ENCOUNTER — Encounter: Payer: Self-pay | Admitting: Emergency Medicine

## 2021-10-03 ENCOUNTER — Emergency Department: Payer: BC Managed Care – PPO

## 2021-10-03 ENCOUNTER — Other Ambulatory Visit: Payer: Self-pay

## 2021-10-03 ENCOUNTER — Emergency Department
Admission: EM | Admit: 2021-10-03 | Discharge: 2021-10-03 | Disposition: A | Discharge status: Home / Self Care | Payer: BC Managed Care – PPO | Attending: Emergency Medicine | Admitting: Emergency Medicine

## 2021-10-03 DIAGNOSIS — R109 Unspecified abdominal pain: Secondary | ICD-10-CM | POA: Insufficient documentation

## 2021-10-03 DIAGNOSIS — M549 Dorsalgia, unspecified: Secondary | ICD-10-CM | POA: Diagnosis present

## 2021-10-03 DIAGNOSIS — M545 Low back pain, unspecified: Secondary | ICD-10-CM

## 2021-10-03 DIAGNOSIS — R39198 Other difficulties with micturition: Secondary | ICD-10-CM | POA: Insufficient documentation

## 2021-10-03 DIAGNOSIS — G8929 Other chronic pain: Secondary | ICD-10-CM | POA: Diagnosis not present

## 2021-10-03 LAB — URINALYSIS, ROUTINE W REFLEX MICROSCOPIC
Bilirubin Urine: NEGATIVE
Glucose, UA: NEGATIVE mg/dL
Ketones, ur: NEGATIVE mg/dL
Leukocytes,Ua: NEGATIVE
Nitrite: NEGATIVE
Protein, ur: NEGATIVE mg/dL
Specific Gravity, Urine: 1.018 (ref 1.005–1.030)
pH: 5 (ref 5.0–8.0)

## 2021-10-03 LAB — POC URINE PREG, ED: Preg Test, Ur: NEGATIVE

## 2021-10-03 MED ORDER — METHOCARBAMOL 500 MG PO TABS: ORAL_TABLET | ORAL | 0 refills | Status: DC

## 2021-10-03 MED ORDER — OXYCODONE-ACETAMINOPHEN 7.5-325 MG PO TABS
1.0000 | ORAL_TABLET | Freq: Four times a day (QID) | ORAL | 0 refills | Status: DC | PRN
Start: 2021-10-03 — End: 2021-10-08

## 2021-10-03 MED ORDER — METHOCARBAMOL 500 MG PO TABS
1000.0000 mg | ORAL_TABLET | Freq: Once | ORAL | Status: AC
  Administered 2021-10-03: 1000 mg via ORAL
  Filled 2021-10-03: qty 2

## 2021-10-03 MED ORDER — KETOROLAC TROMETHAMINE 30 MG/ML IJ SOLN
30.0000 mg | Freq: Once | INTRAMUSCULAR | Status: AC
  Administered 2021-10-03: 30 mg via INTRAMUSCULAR
  Filled 2021-10-03: qty 1

## 2021-10-03 MED ORDER — OXYCODONE-ACETAMINOPHEN 7.5-325 MG PO TABS
1.0000 | ORAL_TABLET | Freq: Once | ORAL | Status: AC
  Administered 2021-10-03: 1 via ORAL
  Filled 2021-10-03: qty 1

## 2021-10-03 MED ORDER — ETODOLAC 400 MG PO TABS: 400.0000 mg | ORAL_TABLET | Freq: Two times a day (BID) | ORAL | 0 refills | Status: DC

## 2021-10-03 NOTE — ED Provider Notes (Signed)
? ?[facility] ?Provider Note ? ? ? Event Date/Time  ? First MD Initiated Contact with Patient 10/03/21 [id number]   ?  (approximate) ? ? ?History  ? ?Back Pain ? ? ?HPI ? ?Stacey Pope is a 36 y.o. female   presents to the ED with complaint of back pain.  Reports that she was seen by a orthopedist at [facility] on 09/28/2021 at which time lumbar spine x-ray was done to evaluate her back pain.  It was mentioned to her that she does have a kidney stone along with degenerative disc disease.  Patient was given a prescription for tramadol.  She states that now the sensation is different than her normal back pain and also she has some urinary sensation without frequency or burning.  Patient denies any nausea or vomiting at present.  She denies any previous history of kidney stones.  Patient has a history of asthma, sciatica, GERD, migraines. ? ?  ? ? ?Physical Exam  ? ?Triage Vital Signs: ?ED Triage Vitals  ?Enc Vitals Group  ?   BP 10/03/21 0752 (!) 141/84  ?   Pulse Rate 10/03/21 0752 89  ?   Resp 10/03/21 0752 18  ?   Temp 10/03/21 0752 98 ?F (36.7 ?C)  ?   Temp Source 10/03/21 0752 Oral  ?   SpO2 10/03/21 0752 98 %  ?   Weight 10/03/21 0751 229 lb 15 oz (104.3 kg)  ?   Height 10/03/21 0751 5\' 6"  (1.676 m)  ?   Head Circumference --   ?   Peak Flow --   ?   Pain Score 10/03/21 0750 8  ?   Pain Loc --   ?   Pain Edu? --   ?   Excl. in [place]? --   ? ? ?Most recent vital signs: ?Vitals:  ? 10/03/21 0752  ?BP: (!) 141/84  ?Pulse: 89  ?Resp: 18  ?Temp: 98 ?F (36.7 ?C)  ?SpO2: 98%  ? ? ? ?General: Awake, no distress.  Tearful and looks uncomfortable sitting on the stretcher. ?CV:  Good peripheral perfusion.  Heart regular rate and rhythm. ?Resp:  Normal effort.  Lungs are clear bilaterally. ?Abd:  No distention.  ?Other:  There is point tenderness on palpation of the lumbar spine and paravertebral muscles bilaterally.  Range of motion is slow and guarded.  Patient also has some flank tenderness on percussion.  Good  muscle strength however patient range of motion is slow and guarded secondary to increased pain.  She continues to ambulate without any assistance in the emergency department. ? ? ?ED Results / Procedures / Treatments  ? ?Labs ?(all labs ordered are listed, but only abnormal results are displayed) ?Labs Reviewed  ?URINALYSIS, ROUTINE W REFLEX MICROSCOPIC - Abnormal; Notable for the following components:  ?    Result Value  ? Color, Urine YELLOW (*)   ? APPearance HAZY (*)   ? Hgb urine dipstick SMALL (*)   ? Bacteria, UA MANY (*)   ? All other components within normal limits  ?POC URINE PREG, ED  ? ? ? ?RADIOLOGY ?CT scan renal study images were reviewed by myself with no obvious abnormality noted.  Urolithiasis noted on the study per radiologist.  He does mention degenerative changes in the lumbar spine.  Additional findings normal appendix and hepatic steatosis.   ? ? ? ?PROCEDURES: ? ?Critical Care performed:  ? ?Procedures ? ? ?MEDICATIONS ORDERED IN ED: ?Medications  ?ketorolac (TORADOL) 30  MG/ML injection 30 mg (30 mg Intramuscular Given 10/03/21 0925)  ?oxyCODONE-acetaminophen (PERCOCET) 7.5-325 MG per tablet 1 tablet (1 tablet Oral Given 10/03/21 0925)  ?methocarbamol (ROBAXIN) tablet 1,000 mg (1,000 mg Oral Given 10/03/21 0925)  ? ? ? ?IMPRESSION / MDM / ASSESSMENT AND PLAN / ED COURSE  ?I reviewed the triage vital signs and the nursing notes. ? ? ?Differential diagnosis includes, but is not limited to, acute exacerbation of chronic pain, urolithiasis, lumbar strain. ? ? ?36 year old female presents to the ED with complaint of back pain.  She currently is seeing a doctor at [facility] for her chronic back pain but states that today's pain is somewhat different and she is now worried about a kidney stone.  Patient urinalysis showed many bacteria but no RBCs.  Pregnancy test was negative.  CT renal stone study did not show urolithiasis and was discussed with patient.  It is felt that this is more of an acute  exacerbation of her chronic low back pain with muscle spasms.  Patient was given methocarbamol 1000 mg p.o., Percocet 7.5 and Toradol 30 mg IM.  Was getting some relief with medication prior to discharge.  I discussed short-term medications with her and that she should continue to follow-up with her orthopedist.  Prescription for methocarbamol 500 mg 1 or 2 every 6 hours as needed for muscle spasms, oxycodone 7.5 and etodolac 400 mg twice daily with food. ? ? ?  ? ? ?FINAL CLINICAL IMPRESSION(S) / ED DIAGNOSES  ? ?Final diagnoses:  ?Acute exacerbation of chronic low back pain  ? ? ? ?Rx / DC Orders  ? ?ED Discharge Orders   ? ?      Ordered  ?  methocarbamol (ROBAXIN) 500 MG tablet       ? 10/03/21 1014  ?  oxyCODONE-acetaminophen (PERCOCET) 7.5-325 MG tablet  Every 6 hours PRN       ? 10/03/21 1014  ?  etodolac (LODINE) 400 MG tablet  2 times daily       ? 10/03/21 1014  ? ?  ?  ? ?  ? ? ? ?Note:  This document was prepared using Dragon voice recognition software and may include unintentional dictation errors. ?  ?Johnn Hai, PA-C ?10/03/21 1025 ? ?  ?Lavonia Drafts, MD ?10/03/21 1150 ? ?

## 2021-10-03 NOTE — ED Triage Notes (Signed)
C/O lower back pain since Friday that has progressively worsened over the weekend.  Had xrays to back on Wednesday for chronic back pain that possibly showed a stone?.  STates this pain is not typical back pain. ? ?AAOx3.  Skin warm and dry. NAD ?

## 2021-10-03 NOTE — Discharge Instructions (Addendum)
Follow-up with your orthopedist if any continued problems with your back.  Moist heat or ice to your back as needed for discomfort.  The etodolac is a medication similar to the shot that she received.  The methocarbamol is 1 or 2 tablets every 6 hours as needed for muscle spasms and oxycodone with acetaminophen is for severe pain every 6 hours as needed.  Do not drive or operate machinery while taking the muscle relaxant and pain medication.  The etodolac is safe for you to drive as it does not cause drowsiness. ?

## 2021-10-08 ENCOUNTER — Emergency Department
Admission: EM | Admit: 2021-10-08 | Discharge: 2021-10-08 | Disposition: A | Discharge status: Home / Self Care | Payer: BC Managed Care – PPO | Attending: Emergency Medicine | Admitting: Emergency Medicine

## 2021-10-08 ENCOUNTER — Other Ambulatory Visit: Payer: Self-pay

## 2021-10-08 ENCOUNTER — Encounter: Payer: Self-pay | Admitting: Intensive Care

## 2021-10-08 DIAGNOSIS — M5431 Sciatica, right side: Secondary | ICD-10-CM

## 2021-10-08 DIAGNOSIS — M5416 Radiculopathy, lumbar region: Secondary | ICD-10-CM | POA: Diagnosis not present

## 2021-10-08 DIAGNOSIS — M545 Low back pain, unspecified: Secondary | ICD-10-CM | POA: Diagnosis present

## 2021-10-08 DIAGNOSIS — M5441 Lumbago with sciatica, right side: Secondary | ICD-10-CM | POA: Diagnosis not present

## 2021-10-08 MED ORDER — KETOROLAC TROMETHAMINE 60 MG/2ML IM SOLN
60.0000 mg | Freq: Once | INTRAMUSCULAR | Status: AC
  Administered 2021-10-08: 60 mg via INTRAMUSCULAR
  Filled 2021-10-08: qty 2

## 2021-10-08 MED ORDER — TIZANIDINE HCL 2 MG PO TABS: 2.0000 mg | ORAL_TABLET | Freq: Three times a day (TID) | ORAL | 0 refills | Status: AC | PRN

## 2021-10-08 MED ORDER — LIDOCAINE 5 % EX PTCH
1.0000 | MEDICATED_PATCH | Freq: Once | CUTANEOUS | Status: DC
  Administered 2021-10-08: 1 via TRANSDERMAL
  Filled 2021-10-08: qty 1

## 2021-10-08 MED ORDER — OXYCODONE-ACETAMINOPHEN 7.5-325 MG PO TABS
1.0000 | ORAL_TABLET | Freq: Three times a day (TID) | ORAL | 0 refills | Status: DC | PRN
Start: 2021-10-08 — End: 2021-10-08

## 2021-10-08 MED ORDER — PREDNISONE 20 MG PO TABS: ORAL_TABLET | ORAL | 0 refills | Status: AC

## 2021-10-08 MED ORDER — OXYCODONE-ACETAMINOPHEN 7.5-325 MG PO TABS: 1.0000 | ORAL_TABLET | Freq: Three times a day (TID) | ORAL | 0 refills | Status: AC | PRN

## 2021-10-08 NOTE — ED Provider Notes (Signed)
? ? ?[facility] ?Emergency Department Provider Note ? ? ? ? Event Date/Time  ? First MD Initiated Contact with Patient 10/08/21 1840   ?  (approximate) ? ? ?History  ? ?Sciatica ? ? ?HPI ? ?Stacey Pope is a 36 y.o. female presents to the ED for low back pain with right leg referral.  Patient denies any recent injury, trauma, or fall.  She does have a history of lumbar radiculitis and degenerative disc disease.  She denies any bladder or bowel incontinence, foot drop, or saddle anesthesia.  Patient was seen in the ED few days ago for same complaint, and was evaluated with CT imaging.  No acute findings and no evidence of any nephrolithiasis patient was discharged with prescriptions for etodolac, methocarbamol, and a small prescription of oxycodone.  She is expected to be evaluated by orthopedic surgeon [facility] but is awaiting initial evaluation. ?  ? ? ?Physical Exam  ? ?Triage Vital Signs: ?ED Triage Vitals  ?Enc Vitals Group  ?   BP 10/08/21 1833 (!) 159/97  ?   Pulse Rate 10/08/21 1833 94  ?   Resp 10/08/21 1833 18  ?   Temp 10/08/21 1833 98.6 ?F (37 ?C)  ?   Temp Source 10/08/21 1833 Oral  ?   SpO2 10/08/21 1833 97 %  ?   Weight 10/08/21 1832 280 lb (127 kg)  ?   Height 10/08/21 1832 5\' 6"  (1.676 m)  ?   Head Circumference --   ?   Peak Flow --   ?   Pain Score 10/08/21 1832 10  ?   Pain Loc --   ?   Pain Edu? --   ?   Excl. in [place]? --   ? ? ?Most recent vital signs: ?Vitals:  ? 10/08/21 1833  ?BP: (!) 159/97  ?Pulse: 94  ?Resp: 18  ?Temp: 98.6 ?F (37 ?C)  ?SpO2: 97%  ? ? ?General Awake, no distress.  ?CV:  Good peripheral perfusion.  ?RESP:  Normal effort.  ?ABD:  No distention.  ?MSK:  Normal spinal alignment without midline tenderness, spasm, deformity, or step-off.  Patient tender to palpation to the right SI joint region.  Normal transition from sit to stand. ?NEURO: Normal LE DTRs bilaterally.  Normal toe dorsiflexion on exam. ? ?ED Results / Procedures / Treatments   ? ?Labs ?(all labs ordered are listed, but only abnormal results are displayed) ?Labs Reviewed - No data to display ? ? ?EKG ? ? ?RADIOLOGY ? ? ?No results found. ? ? ?PROCEDURES: ? ?Critical Care performed: No ? ?Procedures ? ? ?MEDICATIONS ORDERED IN ED: ?Medications  ?lidocaine (LIDODERM) 5 % 1 patch (1 patch Transdermal Patch Applied 10/08/21 1925)  ?ketorolac (TORADOL) injection 60 mg (60 mg Intramuscular Given 10/08/21 1919)  ? ? ? ?IMPRESSION / MDM / ASSESSMENT AND PLAN / ED COURSE  ?I reviewed the triage vital signs and the nursing notes. ?             ?               ? ?Differential diagnosis includes, but is not limited to, sciatica, lumbar radiculopathy, lumbar strain ? ?Patient to the ED for evaluation of an acute exacerbation of her sciatica.  Patient reports ongoing previously prescribed medications.  No red flags on exam.  I reviewed the patient's chart and MRI done in June 2022. Patient's diagnosis is consistent with L5-S1 radiculopathy. Patient will be discharged home with  prescriptions for tizanidine, prednisone, Lidoderm patches, and a small prescription of oxycodone 7.5 mg.  A work note is provided as requested.  Patient is to follow up with her Ortho provider or pain management specialist as needed or otherwise directed. Patient is given ED precautions to return to the ED for any worsening or new symptoms. ? ? ?FINAL CLINICAL IMPRESSION(S) / ED DIAGNOSES  ? ?Final diagnoses:  ?Lumbar radiculopathy  ?Sciatic nerve pain, right  ? ? ? ?Rx / DC Orders  ? ?ED Discharge Orders   ? ?      Ordered  ?  predniSONE (DELTASONE) 20 MG tablet       ? 10/08/21 1906  ?  tiZANidine (ZANAFLEX) 2 MG tablet  Every 8 hours PRN       ? 10/08/21 1906  ?  oxyCODONE-acetaminophen (PERCOCET) 7.5-325 MG tablet  Every 8 hours PRN       ? 10/08/21 1906  ? ?  ?  ? ?  ? ? ? ?Note:  This document was prepared using Dragon voice recognition software and may include unintentional dictation errors. ? ?  ?Melvenia Needles,  PA-C ?10/08/21 1942 ? ?  ?Nance Pear, MD ?10/08/21 2120 ? ?

## 2021-10-08 NOTE — Discharge Instructions (Addendum)
Increase your Lyrica dose to 100 mg 3 times daily as discussed. Follow-up with your provider for continued treatment.  ? ?You need to have [facility] "PowerShare" your MRI images with [facility] orthopedic surgeon.  ?

## 2021-10-08 NOTE — ED Triage Notes (Signed)
Patient c/o lower back pain that radiates down right leg ?

## 2021-11-13 ENCOUNTER — Ambulatory Visit: Payer: Self-pay

## 2021-11-14 ENCOUNTER — Other Ambulatory Visit
Admission: RE | Admit: 2021-11-14 | Discharge: 2021-11-14 | Disposition: A | Discharge status: Home / Self Care | Payer: BC Managed Care – PPO | Source: Ambulatory Visit | Attending: Internal Medicine | Admitting: Internal Medicine

## 2021-11-14 DIAGNOSIS — R0602 Shortness of breath: Secondary | ICD-10-CM | POA: Insufficient documentation

## 2021-11-14 DIAGNOSIS — R0789 Other chest pain: Secondary | ICD-10-CM | POA: Diagnosis present

## 2021-11-14 LAB — D-DIMER, QUANTITATIVE: D-Dimer, Quant: 0.45 ug/mL-FEU (ref 0.00–0.50)

## 2021-11-21 ENCOUNTER — Emergency Department: Payer: BC Managed Care – PPO

## 2021-11-21 ENCOUNTER — Other Ambulatory Visit: Payer: Self-pay

## 2021-11-21 ENCOUNTER — Observation Stay
Admission: EM | Admit: 2021-11-21 | Discharge: 2021-11-23 | Disposition: A | Discharge status: Home / Self Care | Payer: BC Managed Care – PPO | Attending: Internal Medicine | Admitting: Internal Medicine

## 2021-11-21 DIAGNOSIS — J4541 Moderate persistent asthma with (acute) exacerbation: Secondary | ICD-10-CM | POA: Diagnosis not present

## 2021-11-21 DIAGNOSIS — F17290 Nicotine dependence, other tobacco product, uncomplicated: Secondary | ICD-10-CM | POA: Insufficient documentation

## 2021-11-21 DIAGNOSIS — Z720 Tobacco use: Secondary | ICD-10-CM | POA: Diagnosis present

## 2021-11-21 DIAGNOSIS — M5441 Lumbago with sciatica, right side: Secondary | ICD-10-CM | POA: Diagnosis not present

## 2021-11-21 DIAGNOSIS — K219 Gastro-esophageal reflux disease without esophagitis: Secondary | ICD-10-CM | POA: Diagnosis present

## 2021-11-21 DIAGNOSIS — Z6841 Body Mass Index (BMI) 40.0 and over, adult: Secondary | ICD-10-CM

## 2021-11-21 DIAGNOSIS — Z79899 Other long term (current) drug therapy: Secondary | ICD-10-CM | POA: Insufficient documentation

## 2021-11-21 DIAGNOSIS — R0602 Shortness of breath: Secondary | ICD-10-CM | POA: Diagnosis present

## 2021-11-21 DIAGNOSIS — M5442 Lumbago with sciatica, left side: Secondary | ICD-10-CM | POA: Diagnosis not present

## 2021-11-21 DIAGNOSIS — R0603 Acute respiratory distress: Secondary | ICD-10-CM | POA: Diagnosis present

## 2021-11-21 DIAGNOSIS — R1310 Dysphagia, unspecified: Secondary | ICD-10-CM | POA: Diagnosis not present

## 2021-11-21 DIAGNOSIS — J45901 Unspecified asthma with (acute) exacerbation: Secondary | ICD-10-CM | POA: Diagnosis present

## 2021-11-21 HISTORY — DX: Unspecified osteoarthritis, unspecified site: M19.90

## 2021-11-21 LAB — CBC WITH DIFFERENTIAL/PLATELET
Abs Immature Granulocytes: 0.52 10*3/uL — ABNORMAL HIGH (ref 0.00–0.07)
Basophils Absolute: 0.1 10*3/uL (ref 0.0–0.1)
Basophils Relative: 0 %
Eosinophils Absolute: 0.1 10*3/uL (ref 0.0–0.5)
Eosinophils Relative: 0 %
HCT: 45.6 % (ref 36.0–46.0)
Hemoglobin: 14.9 g/dL (ref 12.0–15.0)
Immature Granulocytes: 2 %
Lymphocytes Relative: 10 %
Lymphs Abs: 2.2 10*3/uL (ref 0.7–4.0)
MCH: 29.2 pg (ref 26.0–34.0)
MCHC: 32.7 g/dL (ref 30.0–36.0)
MCV: 89.2 fL (ref 80.0–100.0)
Monocytes Absolute: 0.5 10*3/uL (ref 0.1–1.0)
Monocytes Relative: 2 %
Neutro Abs: 18.3 10*3/uL — ABNORMAL HIGH (ref 1.7–7.7)
Neutrophils Relative %: 86 %
Platelets: 383 10*3/uL (ref 150–400)
RBC: 5.11 MIL/uL (ref 3.87–5.11)
RDW: 13.9 % (ref 11.5–15.5)
WBC: 21.6 10*3/uL — ABNORMAL HIGH (ref 4.0–10.5)
nRBC: 0 % (ref 0.0–0.2)

## 2021-11-21 LAB — URINALYSIS, ROUTINE W REFLEX MICROSCOPIC
Bilirubin Urine: NEGATIVE
Glucose, UA: NEGATIVE mg/dL
Hgb urine dipstick: NEGATIVE
Ketones, ur: NEGATIVE mg/dL
Leukocytes,Ua: NEGATIVE
Nitrite: NEGATIVE
Protein, ur: NEGATIVE mg/dL
Specific Gravity, Urine: 1.011 (ref 1.005–1.030)
pH: 6 (ref 5.0–8.0)

## 2021-11-21 LAB — BASIC METABOLIC PANEL
Anion gap: 11 (ref 5–15)
BUN: 14 mg/dL (ref 6–20)
CO2: 23 mmol/L (ref 22–32)
Calcium: 9.5 mg/dL (ref 8.9–10.3)
Chloride: 102 mmol/L (ref 98–111)
Creatinine, Ser: 0.68 mg/dL (ref 0.44–1.00)
GFR, Estimated: >60 mL/min (ref >60)
Glucose, Bld: 209 mg/dL — ABNORMAL HIGH (ref 70–99)
Potassium: 4 mmol/L (ref 3.5–5.1)
Sodium: 136 mmol/L (ref 135–145)

## 2021-11-21 LAB — POC URINE PREG, ED: Preg Test, Ur: NEGATIVE

## 2021-11-21 LAB — TROPONIN I (HIGH SENSITIVITY): Troponin I (High Sensitivity): 5 ng/L (ref <18)

## 2021-11-21 LAB — D-DIMER, QUANTITATIVE: D-Dimer, Quant: 0.27 ug/mL-FEU (ref 0.00–0.50)

## 2021-11-21 MED ORDER — MAGNESIUM SULFATE 2 GM/50ML IV SOLN
2.0000 g | Freq: Once | INTRAVENOUS | Status: AC
  Administered 2021-11-21: 2 g via INTRAVENOUS
  Filled 2021-11-21: qty 50

## 2021-11-21 MED ORDER — IPRATROPIUM-ALBUTEROL 0.5-2.5 (3) MG/3ML IN SOLN
3.0000 mL | Freq: Once | RESPIRATORY_TRACT | Status: AC
  Administered 2021-11-21: 3 mL via RESPIRATORY_TRACT
  Filled 2021-11-21: qty 3

## 2021-11-21 MED ORDER — METHYLPREDNISOLONE SODIUM SUCC 125 MG IJ SOLR
125.0000 mg | Freq: Once | INTRAMUSCULAR | Status: AC
  Administered 2021-11-21: 125 mg via INTRAVENOUS
  Filled 2021-11-21: qty 2

## 2021-11-21 MED ORDER — ALBUTEROL SULFATE HFA 108 (90 BASE) MCG/ACT IN AERS: 2.0000 | INHALATION_SPRAY | RESPIRATORY_TRACT | Status: DC | PRN

## 2021-11-21 MED ORDER — ONDANSETRON 4 MG PO TBDP
4.0000 mg | ORAL_TABLET | Freq: Once | ORAL | Status: AC
  Administered 2021-11-21: 4 mg via ORAL
  Filled 2021-11-21: qty 1

## 2021-11-21 MED ORDER — OXYCODONE-ACETAMINOPHEN 5-325 MG PO TABS
1.0000 | ORAL_TABLET | Freq: Once | ORAL | Status: AC
  Administered 2021-11-21: 1 via ORAL
  Filled 2021-11-21: qty 1

## 2021-11-21 NOTE — ED Provider Triage Note (Signed)
Emergency Medicine Provider Triage Evaluation Note ? ?Stacey Pope , a 36 y.o. female  was evaluated in triage.  Pt complains of shortness of breath. No relief with 3 breathing treatments prior to arrival. She was treated last week with Levaquin, prednisone, albuterol, and LABA without any improvement. No chest  pain. No history of DVT or PE. ? ?Review of Systems  ?Positive: Shortness of breath ?Negative: Fever ? ?Physical Exam  ?BP (!) 145/93   Pulse (!) 116   Temp 98.1 ?F (36.7 ?C) (Oral)   Resp (!) 22   Ht 5\' 6"  (1.676 m)   Wt 136.1 kg   SpO2 99%   BMI 48.42 kg/m?  ?Gen:   Awake, no distress   ?Resp:  Normal effort. Tachypnea, diffuse wheezing ?MSK:   Moves extremities without difficulty  ?Other:   ? ?Medical Decision Making  ?Medically screening exam initiated at 6:45 PM.  Appropriate orders placed.  Stacey Pope was informed that the remainder of the evaluation will be completed by another provider, this initial triage assessment does not replace that evaluation, and the importance of remaining in the ED until their evaluation is complete. ? ?  ?Stacey Fresh, FNP ?11/21/21 2116 ? ?

## 2021-11-21 NOTE — ED Triage Notes (Signed)
Pt here for SOB and dyspnea. Sent by primary care doctor. Pt tachypnic and tachycardic on arrival.  ?

## 2021-11-21 NOTE — ED Provider Notes (Signed)
? ?[facility] ?Provider Note ? ?Patient Contact: 10:35 PM (approximate) ? ? ?History  ? ?Shortness of Breath ? ? ?HPI ? ?Stacey Pope is a 36 y.o. female with history of asthma presents to the emergency department with persistent shortness of breath, wheezing and cough for approximately 10 days.  Patient has been seen and evaluated by her primary care provider and has completed both a course of prednisone and Levaquin.  Patient has been using her albuterol inhaler and Advair inhalers at home with little relief and was referred by her primary care to the emergency department for possible admission.  Patient states that she is required 1 prior admission in the past for an asthma exacerbation. ? ?  ? ? ?Physical Exam  ? ?Triage Vital Signs: ?ED Triage Vitals  ?Enc Vitals Group  ?   BP 11/21/21 1834 (!) 145/93  ?   Pulse Rate 11/21/21 1837 (!) 116  ?   Resp 11/21/21 1834 (!) 22  ?   Temp 11/21/21 1834 98.1 ?F (36.7 ?C)  ?   Temp Source 11/21/21 1834 Oral  ?   SpO2 11/21/21 1834 99 %  ?   Weight 11/21/21 1835 300 lb (136.1 kg)  ?   Height 11/21/21 1835 5\' 6"  (1.676 m)  ?   Head Circumference --   ?   Peak Flow --   ?   Pain Score 11/21/21 1834 10  ?   Pain Loc --   ?   Pain Edu? --   ?   Excl. in [place]? --   ? ? ?Most recent vital signs: ?Vitals:  ? 11/21/21 1834 11/21/21 1837  ?BP: (!) 145/93   ?Pulse:  (!) 116  ?Resp: (!) 22   ?Temp: 98.1 ?F (36.7 ?C)   ?SpO2: 99%   ? ? ? ?General: Alert and in no acute distress. ?Eyes:  PERRL. EOMI. ?Head: No acute traumatic findings ?ENT: ?     Nose: No congestion/rhinnorhea. ?     Mouth/Throat: Mucous membranes are moist. ?Neck: No stridor. No cervical spine tenderness to palpation. ?Cardiovascular:  Good peripheral perfusion ?Respiratory: Patient tachypneic with diminished breath sounds in the lung bases.  She has diffuse wheezing auscultated bilaterally. ?Gastrointestinal: Bowel sounds ?4 quadrants. Soft and nontender to palpation. No guarding or rigidity.  No palpable masses. No distention. No CVA tenderness. ?Musculoskeletal: Full range of motion to all extremities.  ?Neurologic:  No gross focal neurologic deficits are appreciated.  ?Skin:   No rash noted ? ? ?ED Results / Procedures / Treatments  ? ?Labs ?(all labs ordered are listed, but only abnormal results are displayed) ?Labs Reviewed  ?BASIC METABOLIC PANEL - Abnormal; Notable for the following components:  ?    Result Value  ? Glucose, Bld 209 (*)   ? All other components within normal limits  ?CBC WITH DIFFERENTIAL/PLATELET - Abnormal; Notable for the following components:  ? WBC 21.6 (*)   ? Neutro Abs 18.3 (*)   ? Abs Immature Granulocytes 0.52 (*)   ? All other components within normal limits  ?URINALYSIS, ROUTINE W REFLEX MICROSCOPIC - Abnormal; Notable for the following components:  ? Color, Urine YELLOW (*)   ? APPearance HAZY (*)   ? All other components within normal limits  ?D-DIMER, QUANTITATIVE  ?POC URINE PREG, ED  ?TROPONIN I (HIGH SENSITIVITY)  ? ? ? ? ? ?RADIOLOGY ? ?I personally viewed and evaluated these images as part of my medical decision making, as well as reviewing  the written report by the radiologist. ? ?ED Provider Interpretation: I personally interpreted chest x-ray and there were no consolidations, opacities or infiltrates to suggest pneumonia. ? ? ?PROCEDURES: ? ?Critical Care performed: No ? ?Procedures ? ? ?MEDICATIONS ORDERED IN ED: ?Medications  ?ipratropium-albuterol (DUONEB) 0.5-2.5 (3) MG/3ML nebulizer solution 3 mL (3 mLs Nebulization Given 11/21/21 2236)  ?methylPREDNISolone sodium succinate (SOLU-MEDROL) 125 mg/2 mL injection 125 mg (125 mg Intravenous Given 11/21/21 2236)  ?magnesium sulfate IVPB 2 g 50 mL (0 g Intravenous Stopped 11/21/21 2335)  ?oxyCODONE-acetaminophen (PERCOCET/ROXICET) 5-325 MG per tablet 1 tablet (1 tablet Oral Given 11/21/21 2337)  ?ondansetron (ZOFRAN-ODT) disintegrating tablet 4 mg (4 mg Oral Given 11/21/21 2337)  ?ipratropium-albuterol (DUONEB)  0.5-2.5 (3) MG/3ML nebulizer solution 3 mL (3 mLs Nebulization Given 11/21/21 2338)  ?ipratropium-albuterol (DUONEB) 0.5-2.5 (3) MG/3ML nebulizer solution 3 mL (3 mLs Nebulization Given 11/21/21 2338)  ? ? ? ?IMPRESSION / MDM / ASSESSMENT AND PLAN / ED COURSE  ?I reviewed the triage vital signs and the nursing notes. ?             ?               ? ?Assessment and plan ?Cough ?Wheezing ?36 year old female with history of asthma presents to the emergency department with cough, wheezing and shortness of breath. ? ?Patient was tachycardic and tachypneic at triage.  On exam, patient seemed breathless and had diminished breath sounds with expiratory wheezing auscultated diffusely throughout. ? ?Chest x-ray showed no consolidations, opacities or infiltrates to suggest pneumonia.  Troponin and D-dimer within range.  Patient did have elevated white blood cell count on CBC but suspect leukocytosis secondary to prednisone use. ? ?We will administer mag, IV Solu-Medrol and DuoNeb breathing treatments and will reassess. ? ?Patient symptoms did not significantly improve after aforementioned medications.  Will admit to hospitalist service under the care of Dr. Posey Pronto for asthma exacerbation. ? ? ?FINAL CLINICAL IMPRESSION(S) / ED DIAGNOSES  ? ?Final diagnoses:  ?Moderate persistent asthma with exacerbation  ? ? ? ?Rx / DC Orders  ? ?ED Discharge Orders   ? ? None  ? ?  ? ? ? ?Note:  This document was prepared using Dragon voice recognition software and may include unintentional dictation errors. ?  ?Lannie Fields, PA-C ?11/22/21 0002 ? ?  ?Blake Divine, MD ?11/22/21 0013 ? ?

## 2021-11-22 ENCOUNTER — Encounter: Payer: Self-pay | Admitting: Internal Medicine

## 2021-11-22 DIAGNOSIS — R0602 Shortness of breath: Secondary | ICD-10-CM | POA: Diagnosis present

## 2021-11-22 DIAGNOSIS — R0603 Acute respiratory distress: Secondary | ICD-10-CM | POA: Diagnosis present

## 2021-11-22 DIAGNOSIS — R1310 Dysphagia, unspecified: Secondary | ICD-10-CM

## 2021-11-22 LAB — HIV ANTIBODY (ROUTINE TESTING W REFLEX): HIV Screen 4th Generation wRfx: NONREACTIVE

## 2021-11-22 LAB — BRAIN NATRIURETIC PEPTIDE: B Natriuretic Peptide: 39.2 pg/mL (ref 0.0–100.0)

## 2021-11-22 LAB — PROCALCITONIN: Procalcitonin: <0.1 ng/mL

## 2021-11-22 LAB — HEMOGLOBIN A1C
Hgb A1c MFr Bld: 5.9 % — ABNORMAL HIGH (ref 4.8–5.6)
Mean Plasma Glucose: 122.63 mg/dL

## 2021-11-22 MED ORDER — PANTOPRAZOLE SODIUM 40 MG IV SOLR
40.0000 mg | Freq: Two times a day (BID) | INTRAVENOUS | Status: DC
  Administered 2021-11-22: 40 mg via INTRAVENOUS
  Filled 2021-11-22 (×2): qty 10

## 2021-11-22 MED ORDER — LACTATED RINGERS IV SOLN: INTRAVENOUS | Status: AC

## 2021-11-22 MED ORDER — ENOXAPARIN SODIUM 80 MG/0.8ML IJ SOSY
0.5000 mg/kg | PREFILLED_SYRINGE | INTRAMUSCULAR | Status: DC
  Administered 2021-11-22: 67.5 mg via SUBCUTANEOUS
  Filled 2021-11-22 (×2): qty 0.68

## 2021-11-22 MED ORDER — PANTOPRAZOLE SODIUM 40 MG PO TBEC
40.0000 mg | DELAYED_RELEASE_TABLET | Freq: Two times a day (BID) | ORAL | Status: DC
  Administered 2021-11-22 – 2021-11-23 (×3): 40 mg via ORAL
  Filled 2021-11-22 (×3): qty 1

## 2021-11-22 MED ORDER — ACETAMINOPHEN 325 MG PO TABS: 650.0000 mg | ORAL_TABLET | Freq: Four times a day (QID) | ORAL | Status: DC | PRN

## 2021-11-22 MED ORDER — HYDROCODONE-ACETAMINOPHEN 5-325 MG PO TABS
1.0000 | ORAL_TABLET | ORAL | Status: DC | PRN
  Administered 2021-11-22 (×4): 2 via ORAL
  Filled 2021-11-22 (×4): qty 2

## 2021-11-22 MED ORDER — METHYLPREDNISOLONE SODIUM SUCC 125 MG IJ SOLR
60.0000 mg | Freq: Two times a day (BID) | INTRAMUSCULAR | Status: DC
  Administered 2021-11-22: 60 mg via INTRAVENOUS
  Filled 2021-11-22: qty 2

## 2021-11-22 MED ORDER — TOPIRAMATE 25 MG PO TABS: 100.0000 mg | ORAL_TABLET | Freq: Two times a day (BID) | ORAL | Status: DC

## 2021-11-22 MED ORDER — GUAIFENESIN 100 MG/5ML PO LIQD
5.0000 mL | ORAL | Status: DC | PRN
Start: 2021-11-22 — End: 2021-11-23

## 2021-11-22 MED ORDER — IPRATROPIUM-ALBUTEROL 0.5-2.5 (3) MG/3ML IN SOLN
3.0000 mL | Freq: Four times a day (QID) | RESPIRATORY_TRACT | Status: DC
  Administered 2021-11-22: 3 mL via RESPIRATORY_TRACT
  Filled 2021-11-22 (×2): qty 3

## 2021-11-22 MED ORDER — DM-GUAIFENESIN ER 30-600 MG PO TB12
1.0000 | ORAL_TABLET | Freq: Two times a day (BID) | ORAL | Status: DC
  Administered 2021-11-22 – 2021-11-23 (×3): 1 via ORAL
  Filled 2021-11-22 (×3): qty 1

## 2021-11-22 MED ORDER — METHYLPREDNISOLONE SODIUM SUCC 40 MG IJ SOLR
40.0000 mg | Freq: Three times a day (TID) | INTRAMUSCULAR | Status: DC
  Administered 2021-11-22 – 2021-11-23 (×3): 40 mg via INTRAVENOUS
  Filled 2021-11-22 (×3): qty 1

## 2021-11-22 MED ORDER — ACETAMINOPHEN 650 MG RE SUPP: 650.0000 mg | Freq: Four times a day (QID) | RECTAL | Status: DC | PRN

## 2021-11-22 MED ORDER — NICOTINE 14 MG/24HR TD PT24
14.0000 mg | MEDICATED_PATCH | Freq: Every day | TRANSDERMAL | Status: DC
  Administered 2021-11-23: 14 mg via TRANSDERMAL
  Filled 2021-11-22 (×2): qty 1

## 2021-11-22 MED ORDER — BISACODYL 5 MG PO TBEC: 5.0000 mg | DELAYED_RELEASE_TABLET | Freq: Every day | ORAL | Status: DC | PRN

## 2021-11-22 MED ORDER — ARFORMOTEROL TARTRATE 15 MCG/2ML IN NEBU
15.0000 ug | INHALATION_SOLUTION | Freq: Two times a day (BID) | RESPIRATORY_TRACT | Status: DC
  Administered 2021-11-22 – 2021-11-23 (×2): 15 ug via RESPIRATORY_TRACT
  Filled 2021-11-22 (×5): qty 2

## 2021-11-22 MED ORDER — HYDRALAZINE HCL 20 MG/ML IJ SOLN: 10.0000 mg | INTRAMUSCULAR | Status: DC | PRN

## 2021-11-22 MED ORDER — PREDNISONE 20 MG PO TABS: 40.0000 mg | ORAL_TABLET | Freq: Every day | ORAL | Status: DC

## 2021-11-22 MED ORDER — REVEFENACIN 175 MCG/3ML IN SOLN
175.0000 ug | Freq: Every day | RESPIRATORY_TRACT | Status: DC
  Administered 2021-11-22 – 2021-11-23 (×2): 175 ug via RESPIRATORY_TRACT
  Filled 2021-11-22 (×2): qty 3

## 2021-11-22 MED ORDER — IPRATROPIUM-ALBUTEROL 0.5-2.5 (3) MG/3ML IN SOLN
3.0000 mL | RESPIRATORY_TRACT | Status: DC | PRN
Start: 2021-11-22 — End: 2021-11-23

## 2021-11-22 MED ORDER — GUAIFENESIN ER 600 MG PO TB12: 600.0000 mg | ORAL_TABLET | Freq: Two times a day (BID) | ORAL | Status: DC | PRN

## 2021-11-22 MED ORDER — HYDRALAZINE HCL 20 MG/ML IJ SOLN: 5.0000 mg | INTRAMUSCULAR | Status: DC | PRN

## 2021-11-22 MED ORDER — SODIUM CHLORIDE 0.9% FLUSH
3.0000 mL | Freq: Two times a day (BID) | INTRAVENOUS | Status: DC
  Administered 2021-11-22 (×2): 3 mL via INTRAVENOUS

## 2021-11-22 MED ORDER — POLYETHYLENE GLYCOL 3350 17 G PO PACK: 17.0000 g | PACK | Freq: Every day | ORAL | Status: DC | PRN

## 2021-11-22 MED ORDER — HEPARIN SODIUM (PORCINE) 5000 UNIT/ML IJ SOLN
5000.0000 [IU] | Freq: Three times a day (TID) | INTRAMUSCULAR | Status: DC
  Administered 2021-11-22: 5000 [IU] via SUBCUTANEOUS
  Filled 2021-11-22: qty 1

## 2021-11-22 MED ORDER — METOPROLOL TARTRATE 5 MG/5ML IV SOLN: 5.0000 mg | INTRAVENOUS | Status: DC | PRN

## 2021-11-22 MED ORDER — ALBUTEROL SULFATE (2.5 MG/3ML) 0.083% IN NEBU: 2.5000 mg | INHALATION_SOLUTION | RESPIRATORY_TRACT | Status: DC | PRN

## 2021-11-22 MED ORDER — OXYCODONE-ACETAMINOPHEN 5-325 MG PO TABS
1.0000 | ORAL_TABLET | Freq: Four times a day (QID) | ORAL | Status: DC | PRN
  Administered 2021-11-22 – 2021-11-23 (×2): 1 via ORAL
  Filled 2021-11-22 (×2): qty 1

## 2021-11-22 MED ORDER — DOCUSATE SODIUM 100 MG PO CAPS
100.0000 mg | ORAL_CAPSULE | Freq: Two times a day (BID) | ORAL | Status: DC
  Filled 2021-11-22 (×2): qty 1

## 2021-11-22 MED ORDER — LIDOCAINE 5 % EX PTCH
1.0000 | MEDICATED_PATCH | CUTANEOUS | Status: DC
  Administered 2021-11-22 – 2021-11-23 (×2): 1 via TRANSDERMAL
  Filled 2021-11-22 (×2): qty 1

## 2021-11-22 MED ORDER — TOPIRAMATE 25 MG PO TABS
25.0000 mg | ORAL_TABLET | Freq: Every day | ORAL | Status: DC
Start: 2021-11-22 — End: 2021-11-23
  Administered 2021-11-22: 25 mg via ORAL
  Filled 2021-11-22: qty 1

## 2021-11-22 MED ORDER — TRAZODONE HCL 50 MG PO TABS
50.0000 mg | ORAL_TABLET | Freq: Every evening | ORAL | Status: DC | PRN
Start: 2021-11-22 — End: 2021-11-23

## 2021-11-22 MED ORDER — SUCRALFATE 1 GM/10ML PO SUSP
1.0000 g | Freq: Three times a day (TID) | ORAL | Status: DC
  Administered 2021-11-22 – 2021-11-23 (×6): 1 g via ORAL
  Filled 2021-11-22 (×8): qty 10

## 2021-11-22 NOTE — Assessment & Plan Note (Signed)
Nicotine patch.  Counseled. 

## 2021-11-22 NOTE — Assessment & Plan Note (Signed)
protonix iv 40 mg bid.  ?

## 2021-11-22 NOTE — Assessment & Plan Note (Signed)
Avoid nsaids.  ?PRN hydrocodone and tylenol.  ? ?

## 2021-11-22 NOTE — Progress Notes (Signed)
36 year old with history of asthma and tobacco use admitted to the hospital for ongoing shortness of breath outpatient.  She had completed 2 rounds of outpatient steroids and Levaquin without any improvement. ?Patient seen and examined at bedside.  Still has exertional dyspnea ? ?Bilateral diffuse rhonchorous breath sounds.  Normal sinus rhythm.  Abdomen is nontender nondistended. ? ?Acute shortness of breath likely from asthma/bronchitis-continue IV steroids, scheduled and as needed bronchodilators.  Procalcitonin negative, BNP is negative, chest x-ray normal, D-dimer normal ? ?Leukocytosis-likely from steroid use ? ?Tobacco use-counseled to quit.  Nicotine patch  ? ?GERD-PPI ? ?Case discussed with RN at bedside ? ?Kerin Kren ?TRH ?

## 2021-11-22 NOTE — Progress Notes (Signed)
PHARMACIST - PHYSICIAN COMMUNICATION ? ?CONCERNING: IV to Oral Route Change Policy ? ?RECOMMENDATION: ?This patient is receiving pantoprazole by the intravenous route.  Based on criteria approved by the Pharmacy and Therapeutics Committee, the intravenous medication(s) is/are being converted to the equivalent oral dose form(s). ? ? ?DESCRIPTION: ?These criteria include: ?The patient is eating (either orally or via tube) and/or has been taking other orally administered medications for a least 24 hours ?The patient has no evidence of active gastrointestinal bleeding or impaired GI absorption (gastrectomy, short bowel, patient on TNA or NPO). ? ?If you have questions about this conversion, please contact the Pharmacy Department  ? ?Benita Gutter, RPH ?11/22/2021 8:52 AM  ?

## 2021-11-22 NOTE — Progress Notes (Signed)
Pt to transfer to [id number]-213-Report given to [facility]. Belongings sent with pt. ?

## 2021-11-22 NOTE — H&P (Deleted)
?History and Physical  ? ? ?Patient: Stacey Pope VXB:[id number] DOB: May 09, 1986 ?DOA: 11/21/2021 ?DOS: the patient was seen and examined on 11/22/2021 ?PCP: Tester, Hillary A, PA-C  ?Patient coming from: Home ? ?Chief Complaint:  ?Chief Complaint  ?Patient presents with  ? Shortness of Breath  ? ?HPI: Stacey Pope is a 36 y.o. female with medical history significant of  ? ?Sob since may 8th. ?Pt has completed 2 rounds of steroids and Levaquin.  ?Pt comes today because he coughing and sob has been worse.  ? ?  ?Review of Systems  ?Constitutional:  Positive for fever.  ?HENT:  Positive for congestion. Negative for sore throat.   ?Respiratory:  Positive for cough, sputum production, shortness of breath and wheezing.   ?All other systems reviewed and are negative. ? ?Past Medical History:  ?Diagnosis Date  ? Arthritis   ? Asthma   ? Herniated lumbar intervertebral disc   ? ?History reviewed. No pertinent surgical history. ?Social History:  reports that she has been smoking cigars. She has never used smokeless tobacco. She reports that she does not drink alcohol and does not use drugs. ? ?Allergies  ?Allergen Reactions  ? Augmentin [Amoxicillin-Pot Clavulanate] Anaphylaxis  ? ? ?History reviewed. No pertinent family history. ? ?Prior to Admission medications   ?Medication Sig Start Date End Date Taking? Authorizing Provider  ?albuterol (VENTOLIN HFA) 108 (90 Base) MCG/ACT inhaler Inhale into the lungs. 01/13/21   [provider]  ?amLODipine (NORVASC) 5 MG tablet Take 5 mg by mouth daily. 08/15/21   [provider]  ?benzonatate (TESSALON) 200 MG capsule Take 200 mg by mouth 3 (three) times daily as needed. 09/09/21   [provider]  ?buPROPion (WELLBUTRIN XL) 150 MG 24 hr tablet Take by mouth. 07/18/21   [provider]  ?cyclobenzaprine (FLEXERIL) 10 MG tablet Take by mouth. 11/05/20   [provider]  ?doxycycline (VIBRAMYCIN) 100 MG capsule Take 100 mg by mouth 2 (two) times  daily. 09/13/21   [provider]  ?EPINEPHrine 0.3 mg/0.3 mL IJ SOAJ injection Inject into the muscle. 08/09/20   [provider]  ?ergocalciferol (VITAMIN D2) 1.25 MG (50000 UT) capsule Take by mouth. 07/27/21   [provider]  ?etodolac (LODINE) 400 MG tablet Take 1 tablet (400 mg total) by mouth 2 (two) times daily. 10/03/21   Tommi Rumps, PA-C  ?fluticasone (FLONASE) 50 MCG/ACT nasal spray Place 2 sprays into both nostrils daily. 01/16/20 02/15/20  Shaune Pollack, MD  ?fluticasone-salmeterol (ADVAIR) 250-50 MCG/ACT AEPB INHALE 1 DOSE BY MOUTH TWICE DAILY 09/06/21   [provider]  ?ibuprofen (ADVIL) 200 MG tablet Take by mouth.    [provider]  ?loratadine (CLARITIN) 10 MG tablet Take by mouth.    [provider]  ?meloxicam (MOBIC) 15 MG tablet Take by mouth. 02/15/21 02/15/22  [provider]  ?methylPREDNISolone (MEDROL DOSEPAK) 4 MG TBPK tablet See admin instructions. 09/09/21   [provider]  ?omeprazole (PRILOSEC) 10 MG capsule Take 10 mg by mouth daily.    [provider]  ?omeprazole (PRILOSEC) 20 MG capsule Take by mouth.    [provider]  ?pregabalin (LYRICA) 75 MG capsule Take by mouth. 10/10/21 10/10/22  [provider]  ?promethazine-dextromethorphan (PROMETHAZINE-DM) 6.25-15 MG/5ML syrup Take 5 mLs by mouth every 6 (six) hours as needed. 09/13/21   [provider]  ?topiramate (TOPAMAX) 100 MG tablet Take 100 mg by mouth 2 (two) times daily.    [provider]  ?topiramate (TOPAMAX) 25 MG tablet Take 25 mg by mouth at bedtime. 09/06/21   [provider]  ?traMADol (ULTRAM) 50 MG tablet Take 50 mg by mouth every 8 (eight) hours as needed. 09/28/21   [provider]  ? ? ?Physical Exam: ?Vitals:  ? 11/21/21 1834 11/21/21 1835 11/21/21 1837  ?BP: (!) 145/93    ?Pulse:   (!) 116  ?Resp: (!) 22    ?Temp: 98.1 ?F (36.7 ?C)    ?TempSrc: Oral    ?SpO2: 99%    ?Weight:  136.1 kg    ?Height:  5\' 6"  (1.676 m)   ? ?Physical Exam ?Vitals and nursing note reviewed.  ?Constitutional:   ?   General: She is not in acute distress. ?   Appearance: Normal appearance. She is not ill-appearing, toxic-appearing or diaphoretic.  ?HENT:  ?   Head: Normocephalic and atraumatic.  ?   Right Ear: Hearing and external ear normal.  ?   Left Ear: Hearing and external ear normal.  ?   Nose: Nose normal. No nasal deformity.  ?   Mouth/Throat:  ?   Lips: Pink.  ?   Mouth: Mucous membranes are moist.  ?   Tongue: No lesions.  ?   Pharynx: Oropharynx is clear.  ?Eyes:  ?   Extraocular Movements: Extraocular movements intact.  ?   Pupils: Pupils are equal, round, and reactive to light.  ?Neck:  ?   Vascular: No carotid bruit.  ?Cardiovascular:  ?   Rate and Rhythm: Normal rate and regular rhythm.  ?   Pulses: Normal pulses.  ?   Heart sounds: Normal heart sounds.  ?Pulmonary:  ?   Effort: Pulmonary effort is normal.  ?   Breath sounds: Normal breath sounds.  ?Abdominal:  ?   General: Bowel sounds are normal. There is no distension.  ?   Palpations: Abdomen is soft. There is no mass.  ?   Tenderness: There is no abdominal tenderness. There is no guarding.  ?   Hernia: No hernia is present.  ?Musculoskeletal:  ?   Right lower leg: No edema.  ?   Left lower leg: No edema.  ?Skin: ?   General: Skin is warm.  ?Neurological:  ?   General: No focal deficit present.  ?   Mental Status: She is alert and oriented to person, place, and time.  ?   Cranial Nerves: Cranial nerves 2-12 are intact.  ?   Motor: Motor function is intact.  ?Psychiatric:     ?   Attention and Perception: Attention normal.     ?   Mood and Affect: Mood normal.     ?   Speech: Speech normal.     ?   Behavior: Behavior normal. Behavior is cooperative.     ?   Cognition and Memory: Cognition normal.  ? ? ?Data Reviewed: ?Results for orders placed or performed during the hospital encounter of 11/21/21 (from the past 24 hour(s))  ?Basic metabolic panel      Status: Abnormal  ? Collection Time: 11/21/21  6:48 PM  ?Result Value Ref Range  ? Sodium 136 135 - 145 mmol/L  ? Potassium 4.0 3.5 - 5.1 mmol/L  ? Chloride 102 98 - 111 mmol/L  ? CO2 23 22 - 32 mmol/L  ? Glucose, Bld 209 (H) 70 - 99 mg/dL  ? BUN 14 6 - 20 mg/dL  ? Creatinine, Ser 0.68 0.44 - 1.00 mg/dL  ?  Calcium 9.5 8.9 - 10.3 mg/dL  ? GFR, Estimated >60 >60 mL/min  ? Anion gap 11 5 - 15  ?Troponin I (High Sensitivity)     Status: None  ? Collection Time: 11/21/21  6:48 PM  ?Result Value Ref Range  ? Troponin I (High Sensitivity) 5 <18 ng/L  ?CBC with Differential     Status: Abnormal  ? Collection Time: 11/21/21  6:48 PM  ?Result Value Ref Range  ? WBC 21.6 (H) 4.0 - 10.5 K/uL  ? RBC 5.11 3.87 - 5.11 MIL/uL  ? Hemoglobin 14.9 12.0 - 15.0 g/dL  ? HCT 45.6 36.0 - 46.0 %  ? MCV 89.2 80.0 - 100.0 fL  ? MCH 29.2 26.0 - 34.0 pg  ? MCHC 32.7 30.0 - 36.0 g/dL  ? RDW 13.9 11.5 - 15.5 %  ? Platelets 383 150 - 400 K/uL  ? nRBC 0.0 0.0 - 0.2 %  ? Neutrophils Relative % 86 %  ? Neutro Abs 18.3 (H) 1.7 - 7.7 K/uL  ? Lymphocytes Relative 10 %  ? Lymphs Abs 2.2 0.7 - 4.0 K/uL  ? Monocytes Relative 2 %  ? Monocytes Absolute 0.5 0.1 - 1.0 K/uL  ? Eosinophils Relative 0 %  ? Eosinophils Absolute 0.1 0.0 - 0.5 K/uL  ? Basophils Relative 0 %  ? Basophils Absolute 0.1 0.0 - 0.1 K/uL  ? Immature Granulocytes 2 %  ? Abs Immature Granulocytes 0.52 (H) 0.00 - 0.07 K/uL  ?D-dimer, quantitative     Status: None  ? Collection Time: 11/21/21  6:48 PM  ?Result Value Ref Range  ? D-Dimer, Quant 0.27 0.00 - 0.50 ug/mL-FEU  ?Urinalysis, Routine w reflex microscopic Urine, Clean Catch     Status: Abnormal  ? Collection Time: 11/21/21  7:00 PM  ?Result Value Ref Range  ? Color, Urine YELLOW (A) YELLOW  ? APPearance HAZY (A) CLEAR  ? Specific Gravity, Urine 1.011 1.005 - 1.030  ? pH 6.0 5.0 - 8.0  ? Glucose, UA NEGATIVE NEGATIVE mg/dL  ? Hgb urine dipstick NEGATIVE NEGATIVE  ? Bilirubin Urine NEGATIVE NEGATIVE  ? Ketones, ur NEGATIVE NEGATIVE mg/dL   ? Protein, ur NEGATIVE NEGATIVE mg/dL  ? Nitrite NEGATIVE NEGATIVE  ? Leukocytes,Ua NEGATIVE NEGATIVE  ?POC urine preg, ED     Status: None  ? Collection Time: 11/21/21  7:04 PM  ?Result Value Ref Range  ? P

## 2021-11-22 NOTE — H&P (Signed)
?History and Physical  ? ? ?Patient: Stacey Pope VXB:[id number] DOB: May 09, 1986 ?DOA: 11/21/2021 ?DOS: the patient was seen and examined on 11/22/2021 ?PCP: Tester, Hillary A, PA-C  ?Patient coming from: Home ? ?Chief Complaint:  ?Chief Complaint  ?Patient presents with  ? Shortness of Breath  ? ?HPI: Stacey Pope is a 36 y.o. female with medical history significant of  ? ?Sob since may 8th. ?Pt has completed 2 rounds of steroids and Levaquin.  ?Pt comes today because he coughing and sob has been worse.  ? ?  ?Review of Systems  ?Constitutional:  Positive for fever.  ?HENT:  Positive for congestion. Negative for sore throat.   ?Respiratory:  Positive for cough, sputum production, shortness of breath and wheezing.   ?All other systems reviewed and are negative. ? ?Past Medical History:  ?Diagnosis Date  ? Arthritis   ? Asthma   ? Herniated lumbar intervertebral disc   ? ?History reviewed. No pertinent surgical history. ?Social History:  reports that she has been smoking cigars. She has never used smokeless tobacco. She reports that she does not drink alcohol and does not use drugs. ? ?Allergies  ?Allergen Reactions  ? Augmentin [Amoxicillin-Pot Clavulanate] Anaphylaxis  ? ? ?History reviewed. No pertinent family history. ? ?Prior to Admission medications   ?Medication Sig Start Date End Date Taking? Authorizing Provider  ?albuterol (VENTOLIN HFA) 108 (90 Base) MCG/ACT inhaler Inhale into the lungs. 01/13/21   [provider]  ?amLODipine (NORVASC) 5 MG tablet Take 5 mg by mouth daily. 08/15/21   [provider]  ?benzonatate (TESSALON) 200 MG capsule Take 200 mg by mouth 3 (three) times daily as needed. 09/09/21   [provider]  ?buPROPion (WELLBUTRIN XL) 150 MG 24 hr tablet Take by mouth. 07/18/21   [provider]  ?cyclobenzaprine (FLEXERIL) 10 MG tablet Take by mouth. 11/05/20   [provider]  ?doxycycline (VIBRAMYCIN) 100 MG capsule Take 100 mg by mouth 2 (two) times  daily. 09/13/21   [provider]  ?EPINEPHrine 0.3 mg/0.3 mL IJ SOAJ injection Inject into the muscle. 08/09/20   [provider]  ?ergocalciferol (VITAMIN D2) 1.25 MG (50000 UT) capsule Take by mouth. 07/27/21   [provider]  ?etodolac (LODINE) 400 MG tablet Take 1 tablet (400 mg total) by mouth 2 (two) times daily. 10/03/21   Tommi Rumps, PA-C  ?fluticasone (FLONASE) 50 MCG/ACT nasal spray Place 2 sprays into both nostrils daily. 01/16/20 02/15/20  Shaune Pollack, MD  ?fluticasone-salmeterol (ADVAIR) 250-50 MCG/ACT AEPB INHALE 1 DOSE BY MOUTH TWICE DAILY 09/06/21   [provider]  ?ibuprofen (ADVIL) 200 MG tablet Take by mouth.    [provider]  ?loratadine (CLARITIN) 10 MG tablet Take by mouth.    [provider]  ?meloxicam (MOBIC) 15 MG tablet Take by mouth. 02/15/21 02/15/22  [provider]  ?methylPREDNISolone (MEDROL DOSEPAK) 4 MG TBPK tablet See admin instructions. 09/09/21   [provider]  ?omeprazole (PRILOSEC) 10 MG capsule Take 10 mg by mouth daily.    [provider]  ?omeprazole (PRILOSEC) 20 MG capsule Take by mouth.    [provider]  ?pregabalin (LYRICA) 75 MG capsule Take by mouth. 10/10/21 10/10/22  [provider]  ?promethazine-dextromethorphan (PROMETHAZINE-DM) 6.25-15 MG/5ML syrup Take 5 mLs by mouth every 6 (six) hours as needed. 09/13/21   [provider]  ?topiramate (TOPAMAX) 100 MG tablet Take 100 mg by mouth 2 (two) times daily.    [provider]  ?topiramate (TOPAMAX) 25 MG tablet Take 25 mg by mouth at bedtime. 09/06/21   [provider]  ?traMADol (ULTRAM) 50 MG tablet Take 50 mg by mouth every 8 (eight) hours as needed. 09/28/21   [provider]  ? ? ?Physical Exam: ?Vitals:  ? 11/21/21 1834 11/21/21 1835 11/21/21 1837 11/22/21 0100  ?BP: (!) 145/93   125/77  ?Pulse:   (!) 116 86  ?Resp: (!) 22   15  ?Temp: 98.1 ?F (36.7 ?C)     ?TempSrc: Oral     ?SpO2:  99%   95%  ?Weight:  136.1 kg    ?Height:  5\' 6"  (1.676 m)    ? ?Physical Exam ?Vitals and nursing note reviewed.  ?Constitutional:   ?   General: She is not in acute distress. ?   Appearance: Normal appearance. She is not ill-appearing, toxic-appearing or diaphoretic.  ?HENT:  ?   Head: Normocephalic and atraumatic.  ?   Right Ear: Hearing and external ear normal.  ?   Left Ear: Hearing and external ear normal.  ?   Nose: Nose normal. No nasal deformity.  ?   Mouth/Throat:  ?   Lips: Pink.  ?   Mouth: Mucous membranes are moist.  ?   Tongue: No lesions.  ?   Pharynx: Oropharynx is clear.  ?Eyes:  ?   Extraocular Movements: Extraocular movements intact.  ?   Pupils: Pupils are equal, round, and reactive to light.  ?Neck:  ?   Vascular: No carotid bruit.  ?Cardiovascular:  ?   Rate and Rhythm: Normal rate and regular rhythm.  ?   Pulses: Normal pulses.  ?   Heart sounds: Normal heart sounds.  ?Pulmonary:  ?   Effort: Pulmonary effort is normal.  ?   Breath sounds: Normal breath sounds.  ?Abdominal:  ?   General: Bowel sounds are normal. There is no distension.  ?   Palpations: Abdomen is soft. There is no mass.  ?   Tenderness: There is no abdominal tenderness. There is no guarding.  ?   Hernia: No hernia is present.  ?Musculoskeletal:  ?   Right lower leg: No edema.  ?   Left lower leg: No edema.  ?Skin: ?   General: Skin is warm.  ?Neurological:  ?   General: No focal deficit present.  ?   Mental Status: She is alert and oriented to person, place, and time.  ?   Cranial Nerves: Cranial nerves 2-12 are intact.  ?   Motor: Motor function is intact.  ?Psychiatric:     ?   Attention and Perception: Attention normal.     ?   Mood and Affect: Mood normal.     ?   Speech: Speech normal.     ?   Behavior: Behavior normal. Behavior is cooperative.     ?   Cognition and Memory: Cognition normal.  ? ? ?Data Reviewed: ?Results for orders placed or performed during the hospital encounter of 11/21/21 (from the past 24 hour(s))   ?Basic metabolic panel     Status: Abnormal  ? Collection Time: 11/21/21  6:48 PM  ?Result Value Ref Range  ? Sodium 136 135 - 145 mmol/L  ? Potassium 4.0 3.5 - 5.1 mmol/L  ? Chloride 102 98 - 111 mmol/L  ? CO2 23 22 - 32 mmol/L  ? Glucose, Bld 209 (H) 70 - 99 mg/dL  ? BUN 14 6 - 20 mg/dL  ?  Creatinine, Ser 0.68 0.44 - 1.00 mg/dL  ? Calcium 9.5 8.9 - 10.3 mg/dL  ? GFR, Estimated >60 >60 mL/min  ? Anion gap 11 5 - 15  ?Troponin I (High Sensitivity)     Status: None  ? Collection Time: 11/21/21  6:48 PM  ?Result Value Ref Range  ? Troponin I (High Sensitivity) 5 <18 ng/L  ?CBC with Differential     Status: Abnormal  ? Collection Time: 11/21/21  6:48 PM  ?Result Value Ref Range  ? WBC 21.6 (H) 4.0 - 10.5 K/uL  ? RBC 5.11 3.87 - 5.11 MIL/uL  ? Hemoglobin 14.9 12.0 - 15.0 g/dL  ? HCT 45.6 36.0 - 46.0 %  ? MCV 89.2 80.0 - 100.0 fL  ? MCH 29.2 26.0 - 34.0 pg  ? MCHC 32.7 30.0 - 36.0 g/dL  ? RDW 13.9 11.5 - 15.5 %  ? Platelets 383 150 - 400 K/uL  ? nRBC 0.0 0.0 - 0.2 %  ? Neutrophils Relative % 86 %  ? Neutro Abs 18.3 (H) 1.7 - 7.7 K/uL  ? Lymphocytes Relative 10 %  ? Lymphs Abs 2.2 0.7 - 4.0 K/uL  ? Monocytes Relative 2 %  ? Monocytes Absolute 0.5 0.1 - 1.0 K/uL  ? Eosinophils Relative 0 %  ? Eosinophils Absolute 0.1 0.0 - 0.5 K/uL  ? Basophils Relative 0 %  ? Basophils Absolute 0.1 0.0 - 0.1 K/uL  ? Immature Granulocytes 2 %  ? Abs Immature Granulocytes 0.52 (H) 0.00 - 0.07 K/uL  ?D-dimer, quantitative     Status: None  ? Collection Time: 11/21/21  6:48 PM  ?Result Value Ref Range  ? D-Dimer, Quant 0.27 0.00 - 0.50 ug/mL-FEU  ?Brain natriuretic peptide     Status: None  ? Collection Time: 11/21/21  6:48 PM  ?Result Value Ref Range  ? B Natriuretic Peptide 39.2 0.0 - 100.0 pg/mL  ?Urinalysis, Routine w reflex microscopic Urine, Clean Catch     Status: Abnormal  ? Collection Time: 11/21/21  7:00 PM  ?Result Value Ref Range  ? Color, Urine YELLOW (A) YELLOW  ? APPearance HAZY (A) CLEAR  ? Specific Gravity, Urine 1.011 1.005  - 1.030  ? pH 6.0 5.0 - 8.0  ? Glucose, UA NEGATIVE NEGATIVE mg/dL  ? Hgb urine dipstick NEGATIVE NEGATIVE  ? Bilirubin Urine NEGATIVE NEGATIVE  ? Ketones, ur NEGATIVE NEGATIVE mg/dL  ? Protein, ur NE

## 2021-11-22 NOTE — Assessment & Plan Note (Signed)
Continue PPI. ?GI consult as outpatient . ?

## 2021-11-22 NOTE — ED Notes (Signed)
Messaged pharmacy regarding Carafate, pt updated with verbal understanding.  ?

## 2021-11-22 NOTE — Assessment & Plan Note (Signed)
A1c

## 2021-11-22 NOTE — Assessment & Plan Note (Signed)
Pt continued on ventolin and steroids.  ?D/W pt about stopping all nsaids. ?Start iv ppi.  ? ?

## 2021-11-22 NOTE — Assessment & Plan Note (Addendum)
Attribute shortness of breath to acute asthma/bronchitis type picture.  Patient has a longstanding smoking history as well.  PFTs once stable as outpatient.  D-dimer negative.  Chest x-ray negative.  Troponin negative.  I ordered a BNP which in her case if elevated will probably deserve a 2D echocardiogram to evaluate for pulmonary hypertension and right-sided sleep apnea related complications which the patient can discuss with primary care and obtain. ?Differentials can also be GERD related asthma. ?Patient has completed 2 courses of Levaquin. ?White count today is elevated because she has completed steroid treatments x2 on outpatient basis. ?Do not suspect a pulmonary infection I will however obtain a procalcitonin as patient meets SIRS criteria. ?

## 2021-11-22 NOTE — ED Notes (Signed)
Pt provided something to drink and a snack ?

## 2021-11-23 DIAGNOSIS — R0602 Shortness of breath: Secondary | ICD-10-CM | POA: Diagnosis not present

## 2021-11-23 LAB — BASIC METABOLIC PANEL
Anion gap: 11 (ref 5–15)
BUN: 21 mg/dL — ABNORMAL HIGH (ref 6–20)
CO2: 25 mmol/L (ref 22–32)
Calcium: 9.5 mg/dL (ref 8.9–10.3)
Chloride: 98 mmol/L (ref 98–111)
Creatinine, Ser: 0.59 mg/dL (ref 0.44–1.00)
GFR, Estimated: >60 mL/min (ref >60)
Glucose, Bld: 154 mg/dL — ABNORMAL HIGH (ref 70–99)
Potassium: 4.4 mmol/L (ref 3.5–5.1)
Sodium: 134 mmol/L — ABNORMAL LOW (ref 135–145)

## 2021-11-23 LAB — MAGNESIUM: Magnesium: 2.7 mg/dL — ABNORMAL HIGH (ref 1.7–2.4)

## 2021-11-23 MED ORDER — AZITHROMYCIN 250 MG PO TABS: ORAL_TABLET | ORAL | 0 refills | Status: DC

## 2021-11-23 MED ORDER — LIDOCAINE 5 % EX PTCH: 1.0000 | MEDICATED_PATCH | CUTANEOUS | 0 refills | Status: DC

## 2021-11-23 MED ORDER — NICOTINE 14 MG/24HR TD PT24: 14.0000 mg | MEDICATED_PATCH | Freq: Every day | TRANSDERMAL | 0 refills | Status: DC

## 2021-11-23 MED ORDER — DM-GUAIFENESIN ER 30-600 MG PO TB12: 1.0000 | ORAL_TABLET | Freq: Two times a day (BID) | ORAL | 0 refills | Status: DC

## 2021-11-23 MED ORDER — PREDNISONE 10 MG PO TABS: 50.0000 mg | ORAL_TABLET | Freq: Every day | ORAL | 0 refills | Status: DC

## 2021-11-23 MED ORDER — POLYETHYLENE GLYCOL 3350 17 G PO PACK: 17.0000 g | PACK | Freq: Every day | ORAL | 0 refills | Status: DC | PRN

## 2021-11-23 MED ORDER — AZITHROMYCIN 250 MG PO TABS: 250.0000 mg | ORAL_TABLET | Freq: Every day | ORAL | Status: DC

## 2021-11-23 MED ORDER — IPRATROPIUM-ALBUTEROL 0.5-2.5 (3) MG/3ML IN SOLN
3.0000 mL | RESPIRATORY_TRACT | 0 refills | Status: AC | PRN
Start: 2021-11-23 — End: ?

## 2021-11-23 MED ORDER — AZITHROMYCIN 250 MG PO TABS
500.0000 mg | ORAL_TABLET | Freq: Every day | ORAL | Status: AC
  Administered 2021-11-23: 500 mg via ORAL
  Filled 2021-11-23: qty 2

## 2021-11-23 MED ORDER — OXYCODONE-ACETAMINOPHEN 5-325 MG PO TABS: 1.0000 | ORAL_TABLET | Freq: Four times a day (QID) | ORAL | 0 refills | Status: DC | PRN

## 2021-11-23 NOTE — Progress Notes (Signed)
Patient medically cleared with discharge order placed by MD.  Patient received AVS education prior to discharge including follow up appointments and changes to medications with patient verbalizing understanding of reviewed material.  Patient's PIV removed prior to discharge awaiting volunteer for transportation off the unit to her car. ?

## 2021-11-23 NOTE — Discharge Summary (Signed)
?Physician Discharge Summary ?  ?Patient: Stacey Pope MRN: [id number] DOB: [birth year]  ?Admit date:     11/21/2021  ?Discharge date: 11/23/21  ?Discharge Physician: Arnetha CourserSumayya Adaiah Jaskot  ? ?PCP: Tester, Hillary A, PA-C  ? ?Recommendations at discharge:  ?Follow-up with primary care provider within a week. ?Patient is being discharged on 4 more days of Zithromax and a steroid taper. ?Please check blood pressure and consider restarting amlodipine if needed. ? ?Discharge Diagnoses: ?Principal Problem: ?  SOB (shortness of breath) ?Active Problems: ?  Acute asthma exacerbation ?  Tobacco abuse ?  GERD (gastroesophageal reflux disease) ?  Dysphagia ?  Class 3 severe obesity due to excess calories with body mass index (BMI) of 45.0 to 49.9 in adult [facility](HCC) ?  Low back pain due to bilateral sciatica ?  Acute respiratory distress ? ? ?Hospital Course: ?36 year old with history of asthma and tobacco use admitted to the hospital for ongoing shortness of breath outpatient.  She had completed 2 rounds of outpatient steroids and Levaquin without any improvement. ? ?She was admitted for asthma exacerbation.Procalcitonin negative, BNP is negative, chest x-ray normal, D-dimer normal. ?Mild leukocytosis most likely secondary to steroid use. ? ?Patient received IV steroid and bronchodilators with improvement in her symptoms.  Remained on room air. ?She was also started on Zithromax and was given a tapering dose of steroid for discharge. ? ?Patient is also found to have mildly elevated blood pressure, used to take amlodipine which has been stopped by primary care provider per patient.  She was instructed to restart and follow-up with primary care provider for further recommendations. ? ?She was given 20 tablets of Percocet to help with left sided rib cage pain which is being aggravated with excessive coughing. ? ?Patient need counseling to quit smoking as it is aggravating her symptoms. ? ?We will continue with current medications and will  follow-up with her providers. ? ?Assessment and Plan: ?* SOB (shortness of breath) ?Attribute shortness of breath to acute asthma/bronchitis type picture.  Patient has a longstanding smoking history as well.  PFTs once stable as outpatient.  D-dimer negative.  Chest x-ray negative.  Troponin negative.  I ordered a BNP which in her case if elevated will probably deserve a 2D echocardiogram to evaluate for pulmonary hypertension and right-sided sleep apnea related complications which the patient can discuss with primary care and obtain. ?Differentials can also be GERD related asthma. ?Patient has completed 2 courses of Levaquin. ?White count today is elevated because she has completed steroid treatments x2 on outpatient basis. ?Do not suspect a pulmonary infection I will however obtain a procalcitonin as patient meets SIRS criteria. ? ?Acute asthma exacerbation ?Pt continued on ventolin and steroids.  ?D/W pt about stopping all nsaids. ?Start iv ppi.  ? ? ?Tobacco abuse ?Nicotine patch. ?Counseled.  ? ? ?GERD (gastroesophageal reflux disease) ?protonix iv 40 mg bid.  ? ?Dysphagia ?Continue PPI. ?GI consult as outpatient . ? ?Class 3 severe obesity due to excess calories with body mass index (BMI) of 45.0 to 49.9 in adult [facility](HCC) ?A1c. ? ? ?Low back pain due to bilateral sciatica ?Avoid nsaids.  ?PRN hydrocodone and tylenol.  ? ? ?Consultants: None ?Procedures performed: None ?Disposition: Home ?Diet recommendation:  ?Discharge Diet Orders (From admission, onward)  ? ?  Start     Ordered  ? 11/23/21 0000  Diet - low sodium heart healthy       ? 11/23/21 1252  ? ?  ?  ? ?  ? ?Cardiac  diet ?DISCHARGE MEDICATION: ?Allergies as of 11/23/2021   ? ?   Reactions  ? Augmentin [amoxicillin-pot Clavulanate] Anaphylaxis  ? ?  ? ?  ?Medication List  ?  ? ?STOP taking these medications   ? ?benzonatate 200 MG capsule ?Commonly known as: TESSALON ?  ?cyclobenzaprine 10 MG tablet ?Commonly known as: FLEXERIL ?  ?doxycycline 100 MG  capsule ?Commonly known as: VIBRAMYCIN ?  ?etodolac 400 MG tablet ?Commonly known as: LODINE ?  ?fluticasone 50 MCG/ACT nasal spray ?Commonly known as: Flonase ?  ?ibuprofen 200 MG tablet ?Commonly known as: ADVIL ?  ?meloxicam 15 MG tablet ?Commonly known as: MOBIC ?  ?methylPREDNISolone 4 MG Tbpk tablet ?Commonly known as: MEDROL DOSEPAK ?  ?pregabalin 75 MG capsule ?Commonly known as: LYRICA ?  ?promethazine-dextromethorphan 6.25-15 MG/5ML syrup ?Commonly known as: PROMETHAZINE-DM ?  ?traMADol 50 MG tablet ?Commonly known as: ULTRAM ?  ? ?  ? ?TAKE these medications   ? ?albuterol 108 (90 Base) MCG/ACT inhaler ?Commonly known as: VENTOLIN HFA ?Inhale into the lungs. ?  ?amLODipine 5 MG tablet ?Commonly known as: NORVASC ?Take 5 mg by mouth daily. ?  ?azithromycin 250 MG tablet ?Commonly known as: ZITHROMAX ?1 tablet daily for next 4 days ?Start taking on: Nov 24, 2021 ?  ?buPROPion 150 MG 24 hr tablet ?Commonly known as: WELLBUTRIN XL ?Take 150 mg by mouth daily. ?  ?dextromethorphan-guaiFENesin 30-600 MG 12hr tablet ?Commonly known as: MUCINEX DM ?Take 1 tablet by mouth 2 (two) times daily. ?  ?EPINEPHrine 0.3 mg/0.3 mL Soaj injection ?Commonly known as: EPI-PEN ?Inject into the muscle. ?  ?ergocalciferol 1.25 MG (50000 UT) capsule ?Commonly known as: VITAMIN D2 ?Take by mouth. ?  ?fluticasone-salmeterol 250-50 MCG/ACT Aepb ?Commonly known as: ADVAIR ?INHALE 1 DOSE BY MOUTH TWICE DAILY ?  ?ipratropium-albuterol 0.5-2.5 (3) MG/3ML Soln ?Commonly known as: DUONEB ?Take 3 mLs by nebulization every 4 (four) hours as needed. ?  ?lidocaine 5 % ?Commonly known as: LIDODERM ?Place 1 patch onto the skin daily. Remove & Discard patch within 12 hours or as directed by MD ?  ?loratadine 10 MG tablet ?Commonly known as: CLARITIN ?Take by mouth. ?  ?nicotine 14 mg/24hr patch ?Commonly known as: NICODERM CQ - dosed in mg/24 hours ?Place 1 patch (14 mg total) onto the skin daily. ?Start taking on: Nov 24, 2021 ?  ?omeprazole 10  MG capsule ?Commonly known as: PRILOSEC ?Take 10 mg by mouth daily. ?  ?oxyCODONE-acetaminophen 5-325 MG tablet ?Commonly known as: PERCOCET/ROXICET ?Take 1 tablet by mouth every 6 (six) hours as needed for severe pain. ?  ?polyethylene glycol 17 g packet ?Commonly known as: MIRALAX / GLYCOLAX ?Take 17 g by mouth daily as needed for mild constipation. ?  ?predniSONE 10 MG tablet ?Commonly known as: DELTASONE ?Take 5 tablets (50 mg total) by mouth daily. Take 50 mg / 5 tablets for next 5 days, decrease 1 tablet / 10 mg every other day until you finish all the tablets ?What changed:  ?medication strength ?how much to take ?when to take this ?additional instructions ?  ?topiramate 25 MG tablet ?Commonly known as: TOPAMAX ?Take 25 mg by mouth at bedtime. ?  ? ?  ? ? Follow-up Information   ? ? Tester, Hillary A, PA-C. Schedule an appointment as soon as possible for a visit in 1 week(s).   ?Specialty: Physician Assistant ?Contact information: ?[address] ?[place] [address] ?[phone] ? ? ?  ?  ? ?  ?  ? ?  ? ?  Discharge Exam: ?Ceasar Mons Weights  ? 11/21/21 1835  ?Weight: 136.1 kg  ? ?General.     In no acute distress. ?Pulmonary.  Some scattered wheeze bilaterally, normal respiratory effort. ?CV.  Regular rate and rhythm, no JVD, rub or murmur. ?Abdomen.  Soft, nontender, nondistended, BS positive. ?CNS.  Alert and oriented .  No focal neurologic deficit. ?Extremities.  No edema, no cyanosis, pulses intact and symmetrical. ?Psychiatry.  Judgment and insight appears normal.  ? ?Condition at discharge: stable ? ?The results of significant diagnostics from this hospitalization (including imaging, microbiology, ancillary and laboratory) are listed below for reference.  ? ?Imaging Studies: ?DG Chest 2 View ? ?Result Date: 11/21/2021 ?CLINICAL DATA:  Shortness of breath EXAM: CHEST - 2 VIEW COMPARISON:  08/09/2020 FINDINGS: Lungs are clear.  No pleural effusion or pneumothorax. The heart is normal in size.  Visualized osseous structures are within normal limits. IMPRESSION: Normal chest radiographs. Electronically Signed   By: Charline Bills M.D.   On: 11/21/2021 19:11   ? ?Microbiology: ?Results for orders placed o

## 2021-11-23 NOTE — TOC Initial Note (Signed)
Transition of Care (TOC) - Initial/Assessment Note  ? ? ?Patient Details  ?Name: Stacey Pope ?MRN: [id number] ?Date of Birth: October 08, 1985 ? ?Transition of Care (TOC) CM/SW Contact:    ?Chapman Fitch, RN ?Phone Number: ?11/23/2021, 10:50 AM ? ?Clinical Narrative:                 ? ?Transition of Care (TOC) Screening Note ? ? ?Patient Details  ?Name: Stacey Pope ?Date of Birth: 1985-11-07 ? ? ?Transition of Care (TOC) CM/SW Contact:    ?Chapman Fitch, RN ?Phone Number: ?11/23/2021, 10:50 AM ? ? ? ?Transition of Care Department [facility]) has reviewed patient and no TOC needs have been identified at this time. We will continue to monitor patient advancement through interdisciplinary progression rounds. If new patient transition needs arise, please place a TOC consult. ? ?TOC consulted for heart failure screen. Heart failure RN Ricarda Frame has been informed.  ? ? ?  ?  ? ? ?Patient Goals and CMS Choice ?  ?  ?  ? ?Expected Discharge Plan and Services ?  ?  ?  ?  ?  ?                ?  ?  ?  ?  ?  ?  ?  ?  ?  ?  ? ?Prior Living Arrangements/Services ?  ?  ?  ?       ?  ?  ?  ?  ? ?Activities of Daily Living ?Home Assistive Devices/Equipment: None ?ADL Screening (condition at time of admission) ?Patient's cognitive ability adequate to safely complete daily activities?: Yes ?Is the patient deaf or have difficulty hearing?: No ?Does the patient have difficulty seeing, even when wearing glasses/contacts?: No ?Does the patient have difficulty concentrating, remembering, or making decisions?: No ?Patient able to express need for assistance with ADLs?: Yes ?Does the patient have difficulty dressing or bathing?: No ?Independently performs ADLs?: Yes (appropriate for developmental age) ?Does the patient have difficulty walking or climbing stairs?: No ?Weakness of Legs: None ?Weakness of Arms/Hands: None ? ?Permission Sought/Granted ?  ?  ?   ?   ?   ?   ? ?Emotional Assessment ?  ?  ?  ?  ?  ?  ? ?Admission diagnosis:  Acute  respiratory distress [R06.03] ?SOB (shortness of breath) [R06.02] ?Moderate persistent asthma with exacerbation [J45.41] ?Patient Active Problem List  ? Diagnosis Date Noted  ? SOB (shortness of breath) 11/22/2021  ? Dysphagia 11/22/2021  ? Acute respiratory distress 11/22/2021  ? Abnormal thyroid stimulating hormone (TSH) level 07/18/2021  ? Anxiety and depression 07/18/2021  ? Right ear pain 07/18/2021  ? Tobacco abuse 07/18/2021  ? Vitamin D deficiency 07/18/2021  ? Class 3 severe obesity due to excess calories with body mass index (BMI) of 45.0 to 49.9 in adult [facility]) 02/15/2021  ? Elevated blood-pressure reading without diagnosis of hypertension 02/15/2021  ? History of allergic reaction 02/15/2021  ? Lumbar radiculitis 12/12/2018  ? Screening for diabetes mellitus 11/05/2018  ? Low back pain due to bilateral sciatica 11/05/2018  ? Situational anxiety 11/05/2018  ? Acute asthma exacerbation 06/01/2018  ? Asthma 11/24/2013  ? Environmental allergies 11/24/2013  ? GERD (gastroesophageal reflux disease) 11/24/2013  ? Migraine 11/24/2013  ? Moderate persistent asthma without complication 11/24/2013  ? ?PCP:  Tester, Hillary A, PA-C ?Pharmacy:   ?[id number] - [place] - [address] ?[address] ?[place]  [address] ?Phone: [phone] Fax: [phone] ? ?[id number] - [place], [place] - [address] ?[address] ?[place] [address] ?Phone: [phone] Fax: [phone] ? ?[id number] - [place], [place] - [address] AT [facility] [address] ?[address] ?[place] [address] ?Phone: [phone] Fax: [phone] ? ? ? ? ?Social Determinants of Health (SDOH) Interventions ?  ? ?Readmission Risk Interventions ?   ? View : No data to display.  ?  ?  ?  ? ? ? ?

## 2021-11-23 NOTE — Discharge Instructions (Signed)
It is very important that you stop smoking, you are being provided with nicotine patch to be used as needed. ?You are also being given Percocet for pain, please be mindful as it can cause dizziness. ?These pain medications can also result in severe constipation, please use MiraLAX or any fiber supplement to keep yourself well regulated. ?

## 2022-08-21 ENCOUNTER — Emergency Department: Payer: BC Managed Care – PPO

## 2022-08-21 ENCOUNTER — Other Ambulatory Visit: Payer: Self-pay

## 2022-08-21 ENCOUNTER — Emergency Department
Admission: EM | Admit: 2022-08-21 | Discharge: 2022-08-21 | Disposition: A | Discharge status: Home / Self Care | Payer: BC Managed Care – PPO | Attending: Emergency Medicine | Admitting: Emergency Medicine

## 2022-08-21 ENCOUNTER — Encounter: Payer: Self-pay | Admitting: Emergency Medicine

## 2022-08-21 DIAGNOSIS — Z1152 Encounter for screening for COVID-19: Secondary | ICD-10-CM | POA: Insufficient documentation

## 2022-08-21 DIAGNOSIS — I10 Essential (primary) hypertension: Secondary | ICD-10-CM | POA: Diagnosis present

## 2022-08-21 DIAGNOSIS — J45909 Unspecified asthma, uncomplicated: Secondary | ICD-10-CM | POA: Diagnosis not present

## 2022-08-21 DIAGNOSIS — E876 Hypokalemia: Secondary | ICD-10-CM | POA: Diagnosis not present

## 2022-08-21 DIAGNOSIS — D72829 Elevated white blood cell count, unspecified: Secondary | ICD-10-CM | POA: Diagnosis not present

## 2022-08-21 HISTORY — DX: Essential (primary) hypertension: I10

## 2022-08-21 LAB — CBC WITH DIFFERENTIAL/PLATELET
Abs Immature Granulocytes: 0.05 10*3/uL (ref 0.00–0.07)
Basophils Absolute: 0.1 10*3/uL (ref 0.0–0.1)
Basophils Relative: 1 %
Eosinophils Absolute: 0.2 10*3/uL (ref 0.0–0.5)
Eosinophils Relative: 1 %
HCT: 42.1 % (ref 36.0–46.0)
Hemoglobin: 14 g/dL (ref 12.0–15.0)
Immature Granulocytes: 0 %
Lymphocytes Relative: 26 %
Lymphs Abs: 3.6 10*3/uL (ref 0.7–4.0)
MCH: 28.9 pg (ref 26.0–34.0)
MCHC: 33.3 g/dL (ref 30.0–36.0)
MCV: 86.8 fL (ref 80.0–100.0)
Monocytes Absolute: 0.4 10*3/uL (ref 0.1–1.0)
Monocytes Relative: 3 %
Neutro Abs: 9.3 10*3/uL — ABNORMAL HIGH (ref 1.7–7.7)
Neutrophils Relative %: 69 %
Platelets: 300 10*3/uL (ref 150–400)
RBC: 4.85 MIL/uL (ref 3.87–5.11)
RDW: 14.9 % (ref 11.5–15.5)
WBC: 13.6 10*3/uL — ABNORMAL HIGH (ref 4.0–10.5)
nRBC: 0 % (ref 0.0–0.2)

## 2022-08-21 LAB — BASIC METABOLIC PANEL
Anion gap: 12 (ref 5–15)
BUN: 12 mg/dL (ref 6–20)
CO2: 23 mmol/L (ref 22–32)
Calcium: 9.6 mg/dL (ref 8.9–10.3)
Chloride: 100 mmol/L (ref 98–111)
Creatinine, Ser: 0.64 mg/dL (ref 0.44–1.00)
GFR, Estimated: >60 mL/min (ref >60)
Glucose, Bld: 123 mg/dL — ABNORMAL HIGH (ref 70–99)
Potassium: 3 mmol/L — ABNORMAL LOW (ref 3.5–5.1)
Sodium: 135 mmol/L (ref 135–145)

## 2022-08-21 LAB — TROPONIN I (HIGH SENSITIVITY): Troponin I (High Sensitivity): 4 ng/L (ref <18)

## 2022-08-21 LAB — RESP PANEL BY RT-PCR (RSV, FLU A&B, COVID)  RVPGX2
Influenza A by PCR: NEGATIVE
Influenza B by PCR: NEGATIVE
Resp Syncytial Virus by PCR: NEGATIVE
SARS Coronavirus 2 by RT PCR: NEGATIVE

## 2022-08-21 MED ORDER — AZITHROMYCIN 250 MG PO TABS: ORAL_TABLET | ORAL | 0 refills | Status: AC

## 2022-08-21 MED ORDER — METHYLPREDNISOLONE SODIUM SUCC 125 MG IJ SOLR
125.0000 mg | Freq: Once | INTRAMUSCULAR | Status: AC
  Administered 2022-08-21: 125 mg via INTRAVENOUS
  Filled 2022-08-21 (×2): qty 2

## 2022-08-21 MED ORDER — PREDNISONE 50 MG PO TABS: 50.0000 mg | ORAL_TABLET | Freq: Every day | ORAL | 0 refills | Status: AC

## 2022-08-21 MED ORDER — MAGNESIUM SULFATE 2 GM/50ML IV SOLN
2.0000 g | Freq: Once | INTRAVENOUS | Status: AC
  Administered 2022-08-21: 2 g via INTRAVENOUS
  Filled 2022-08-21: qty 50

## 2022-08-21 MED ORDER — MELOXICAM 15 MG PO TABS: 15.0000 mg | ORAL_TABLET | Freq: Every day | ORAL | 11 refills | Status: AC

## 2022-08-21 MED ORDER — POTASSIUM CHLORIDE CRYS ER 20 MEQ PO TBCR
40.0000 meq | EXTENDED_RELEASE_TABLET | Freq: Once | ORAL | Status: AC
  Administered 2022-08-21: 40 meq via ORAL
  Filled 2022-08-21: qty 2

## 2022-08-21 NOTE — ED Provider Notes (Signed)
[facility] Provider Note    Event Date/Time   First MD Initiated Contact with Patient 08/21/22 1740     (approximate)   History   Chief Complaint Hypertension   HPI Stacey Pope is a 37 y.o. female, history of asthma, GERD, migraine, anxiety, obesity, presents to the emergency department for evaluation of hypertension.  She states that over the past few days her blood pressure has consistently been in the 160s over 100s.  She is currently taking amlodipine and a "fluid pill", and she has not missed any dosages.  She states that she has had some cough, congestion, and headaches as well.  Reports worsening of her asthma recently, feeling short of breath despite nebulizer treatments at home.  In addition, she has been battling low back pain and has been resistant to muscle relaxers, acetaminophen, and gabapentin.  Denies fever/chills, chest pain, abdominal pain, rashes, paresthesias, dizziness/lightheadedness, or nausea/vomiting.  History Limitations: No limitations.        Physical Exam  Triage Vital Signs: ED Triage Vitals  Enc Vitals Group     BP 08/21/22 1707 (!) 167/103     Pulse Rate 08/21/22 1707 (!) 109     Resp 08/21/22 1707 18     Temp 08/21/22 1707 98.8 F (37.1 C)     Temp Source 08/21/22 1707 Oral     SpO2 08/21/22 1707 95 %     Weight 08/21/22 1700 (!) 300 lb 0.7 oz (136.1 kg)     Height 08/21/22 1700 5' 6"$  (1.676 m)     Head Circumference --      Peak Flow --      Pain Score 08/21/22 1700 9     Pain Loc --      Pain Edu? --      Excl. in [place]? --     Most recent vital signs: Vitals:   08/21/22 1707  BP: (!) 167/103  Pulse: (!) 109  Resp: 18  Temp: 98.8 F (37.1 C)  SpO2: 95%    General: Awake, NAD.  Skin: Warm, dry. No rashes or lesions.  Eyes: PERRL. Conjunctivae normal.  CV: Good peripheral perfusion.  Resp: Normal effort.  Moderate wheezing in the bases bilaterally. Abd: Soft, non-tender. No distention.  Neuro: At  baseline. No gross neurological deficits.  Musculoskeletal: Normal ROM of all extremities.  No midline spinal tenderness.   Physical Exam    ED Results / Procedures / Treatments  Labs (all labs ordered are listed, but only abnormal results are displayed) Labs Reviewed  CBC WITH DIFFERENTIAL/PLATELET - Abnormal; Notable for the following components:      Result Value   WBC 13.6 (*)    Neutro Abs 9.3 (*)    All other components within normal limits  BASIC METABOLIC PANEL - Abnormal; Notable for the following components:   Potassium 3.0 (*)    Glucose, Bld 123 (*)    All other components within normal limits  RESP PANEL BY RT-PCR (RSV, FLU A&B, COVID)  RVPGX2  TROPONIN I (HIGH SENSITIVITY)  TROPONIN I (HIGH SENSITIVITY)     EKG N/A.    RADIOLOGY  ED Provider Interpretation: I personally reviewed and interpreted this x-ray, no evidence of pneumonia.  DG Chest 2 View  Result Date: 08/21/2022 CLINICAL DATA:  Wheezing.  Elevated blood pressure EXAM: CHEST - 2 VIEW COMPARISON:  11/21/2021 FINDINGS: The heart size and mediastinal contours are within normal limits. Both lungs are clear. The visualized skeletal structures are  unremarkable. Mild right hemidiaphragm elevation. IMPRESSION: No active cardiopulmonary disease. Electronically Signed   By: Abigail Miyamoto M.D.   On: 08/21/2022 18:34    PROCEDURES:  Critical Care performed: N/A.  Procedures    MEDICATIONS ORDERED IN ED: Medications  magnesium sulfate IVPB 2 g 50 mL (0 g Intravenous Stopped 08/21/22 1924)  methylPREDNISolone sodium succinate (SOLU-MEDROL) 125 mg/2 mL injection 125 mg (125 mg Intravenous Given 08/21/22 1827)  potassium chloride SA (KLOR-CON M) CR tablet 40 mEq (40 mEq Oral Given 08/21/22 1942)     IMPRESSION / MDM / ASSESSMENT AND PLAN / ED COURSE  I reviewed the triage vital signs and the nursing notes.                              Differential diagnosis includes, but is not limited to,  hypertension, asthma exacerbation, influenza, COVID-19, RSV, pneumonia, lumbar radiculopathy, lumbar strain.  ED Course Patient appears well, hypertensive here at 167/103.  Wheezing noted on auscultation.  Hesitant to perform nebulizer treatments here so as not to worsen her hypertension/tachycardia.  Will initiate Solu-Medrol and magnesium.  CBC shows mild leukocytosis at 13.6.  No anemia.  BMP shows hypokalemia at 3.0, otherwise no significant electrolyte abnormalities or AKI.  Initial troponin 4, consistent with previous values.  Assessment/Plan Patient presents with concerns for hypertension, shortness of breath, and low back pain.  In regards to her hypertension, I suspect this is likely secondary to recent viral infection or due to her asthma exacerbation.  It does not appear to be life-threatening with a high at this time warranting BP reduction.  No signs of end-organ dysfunction.  Advised her to follow-up with her primary care provider within the next few weeks for reevaluation and determine the need for adjustment in her antihypertensive regimen.    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  She is currently already taken acetaminophen, muscle relaxers, and gabapentin.  Hesitant to initiate any opiate medications at this time, though we both acknowledge that this may potentially be contributing to her hypertension here today.  She has a lumbar MRI scheduled soon.  Advised her to go forward with this prior to any adjustments in medications at this time.   In regards to her shortness of breath, she does have moderate wheezing in the bases bilaterally, consistent with asthma exacerbation.  Fortunately, her x-rays not show any evidence of pneumonia, though could potentially be exacerbated by underlying viral infection, no respiratory panel is negative for COVID-19,  influenza, or RSV.  Treated here with magnesium and Solu-Medrol.  She is already had nebulizer treatments at home.  Will provide her with a course of prednisone and azithromycin to help manage her symptoms.  Encouraged her to follow-up with her primary care provider soon for reevaluation.  She was amenable to this.  Will discharge.  Considered admission, but given her stable presentation and access to follow-up, she is unlikely benefit.       FINAL CLINICAL IMPRESSION(S) / ED DIAGNOSES   Final diagnoses:  Hypertension, unspecified type  Moderate asthma, unspecified whether complicated, unspecified whether persistent     Rx / DC Orders   ED Discharge Orders          Ordered    predniSONE (DELTASONE) 50 MG tablet  Daily with breakfast  08/21/22 1947    azithromycin (ZITHROMAX) 250 MG tablet  Daily        08/21/22 1947    meloxicam (MOBIC) 15 MG tablet  Daily        08/21/22 1947             Note:  This document was prepared using Dragon voice recognition software and may include unintentional dictation errors.   Teodoro Spray, [place] 08/21/22 Juliene Pina    Duffy Bruce, MD 08/24/22 [phone]

## 2022-08-21 NOTE — ED Triage Notes (Signed)
Elevated BP (165/104) x 1 week also c/o back pain, lower back. (Chronic onset 2019)

## 2022-08-21 NOTE — Discharge Instructions (Addendum)
-  I suspect that you have a exacerbation of your asthma.  Your respiratory panel was negative for COVID-19, influenza, or RSV.  No signs of pneumonia on your chest x-ray, please take the azithromycin as prescribed to treat for any possible developing pneumonias.  In addition, please take the full course of the prednisone as prescribed.  You may continue your nebulizer treatments at home.  -In regards to your high blood pressure, I suspect that this is likely elevated due to your asthma exacerbation.  I suspect they will come down on its own after this is controlled.  If not, please follow-up with your primary care provider for possible adjustment of your medication regimen.  -Return to the emergency department anytime if you begin to experience any new or worsening symptoms.

## 2023-12-26 ENCOUNTER — Emergency Department

## 2023-12-26 ENCOUNTER — Other Ambulatory Visit: Payer: Self-pay

## 2023-12-26 ENCOUNTER — Inpatient Hospital Stay
Admission: EM | Admit: 2023-12-26 | Discharge: 2023-12-31 | DRG: 203 | Disposition: A | Discharge status: Home / Self Care | Attending: Internal Medicine | Admitting: Internal Medicine

## 2023-12-26 DIAGNOSIS — J4541 Moderate persistent asthma with (acute) exacerbation: Secondary | ICD-10-CM

## 2023-12-26 DIAGNOSIS — I10 Essential (primary) hypertension: Secondary | ICD-10-CM | POA: Diagnosis present

## 2023-12-26 DIAGNOSIS — Z7951 Long term (current) use of inhaled steroids: Secondary | ICD-10-CM

## 2023-12-26 DIAGNOSIS — F32A Depression, unspecified: Secondary | ICD-10-CM | POA: Diagnosis present

## 2023-12-26 DIAGNOSIS — Z87892 Personal history of anaphylaxis: Secondary | ICD-10-CM

## 2023-12-26 DIAGNOSIS — Z7952 Long term (current) use of systemic steroids: Secondary | ICD-10-CM

## 2023-12-26 DIAGNOSIS — M5431 Sciatica, right side: Secondary | ICD-10-CM | POA: Diagnosis present

## 2023-12-26 DIAGNOSIS — Z880 Allergy status to penicillin: Secondary | ICD-10-CM

## 2023-12-26 DIAGNOSIS — F419 Anxiety disorder, unspecified: Secondary | ICD-10-CM | POA: Diagnosis present

## 2023-12-26 DIAGNOSIS — K219 Gastro-esophageal reflux disease without esophagitis: Secondary | ICD-10-CM | POA: Diagnosis present

## 2023-12-26 DIAGNOSIS — G8929 Other chronic pain: Secondary | ICD-10-CM | POA: Diagnosis present

## 2023-12-26 DIAGNOSIS — D72829 Elevated white blood cell count, unspecified: Secondary | ICD-10-CM | POA: Diagnosis not present

## 2023-12-26 DIAGNOSIS — Z87891 Personal history of nicotine dependence: Secondary | ICD-10-CM

## 2023-12-26 DIAGNOSIS — Z79899 Other long term (current) drug therapy: Secondary | ICD-10-CM

## 2023-12-26 DIAGNOSIS — Z881 Allergy status to other antibiotic agents status: Secondary | ICD-10-CM

## 2023-12-26 DIAGNOSIS — Z1152 Encounter for screening for COVID-19: Secondary | ICD-10-CM

## 2023-12-26 DIAGNOSIS — E66812 Obesity, class 2: Secondary | ICD-10-CM | POA: Diagnosis present

## 2023-12-26 DIAGNOSIS — Z7985 Long-term (current) use of injectable non-insulin antidiabetic drugs: Secondary | ICD-10-CM

## 2023-12-26 DIAGNOSIS — Z8249 Family history of ischemic heart disease and other diseases of the circulatory system: Secondary | ICD-10-CM

## 2023-12-26 DIAGNOSIS — E119 Type 2 diabetes mellitus without complications: Secondary | ICD-10-CM

## 2023-12-26 DIAGNOSIS — M5432 Sciatica, left side: Secondary | ICD-10-CM | POA: Diagnosis present

## 2023-12-26 DIAGNOSIS — E559 Vitamin D deficiency, unspecified: Secondary | ICD-10-CM | POA: Diagnosis present

## 2023-12-26 DIAGNOSIS — R0609 Other forms of dyspnea: Secondary | ICD-10-CM | POA: Diagnosis present

## 2023-12-26 DIAGNOSIS — Z8759 Personal history of other complications of pregnancy, childbirth and the puerperium: Secondary | ICD-10-CM

## 2023-12-26 DIAGNOSIS — J4551 Severe persistent asthma with (acute) exacerbation: Principal | ICD-10-CM | POA: Diagnosis present

## 2023-12-26 DIAGNOSIS — M5441 Lumbago with sciatica, right side: Secondary | ICD-10-CM | POA: Diagnosis present

## 2023-12-26 DIAGNOSIS — Z833 Family history of diabetes mellitus: Secondary | ICD-10-CM

## 2023-12-26 DIAGNOSIS — Z6835 Body mass index (BMI) 35.0-35.9, adult: Secondary | ICD-10-CM

## 2023-12-26 DIAGNOSIS — R110 Nausea: Secondary | ICD-10-CM | POA: Diagnosis present

## 2023-12-26 DIAGNOSIS — Z801 Family history of malignant neoplasm of trachea, bronchus and lung: Secondary | ICD-10-CM

## 2023-12-26 DIAGNOSIS — J45901 Unspecified asthma with (acute) exacerbation: Principal | ICD-10-CM | POA: Diagnosis present

## 2023-12-26 DIAGNOSIS — E669 Obesity, unspecified: Secondary | ICD-10-CM | POA: Diagnosis present

## 2023-12-26 DIAGNOSIS — M5442 Lumbago with sciatica, left side: Secondary | ICD-10-CM | POA: Diagnosis present

## 2023-12-26 DIAGNOSIS — T380X5A Adverse effect of glucocorticoids and synthetic analogues, initial encounter: Secondary | ICD-10-CM | POA: Diagnosis present

## 2023-12-26 HISTORY — DX: Type 2 diabetes mellitus without complications: E11.9

## 2023-12-26 LAB — RESP PANEL BY RT-PCR (RSV, FLU A&B, COVID)  RVPGX2
Influenza A by PCR: NEGATIVE
Influenza B by PCR: NEGATIVE
Resp Syncytial Virus by PCR: NEGATIVE
SARS Coronavirus 2 by RT PCR: NEGATIVE

## 2023-12-26 LAB — COMPREHENSIVE METABOLIC PANEL WITH GFR
ALT: 20 U/L (ref 0–44)
AST: 12 U/L — ABNORMAL LOW (ref 15–41)
Albumin: 3.6 g/dL (ref 3.5–5.0)
Alkaline Phosphatase: 77 U/L (ref 38–126)
Anion gap: 9 (ref 5–15)
BUN: 9 mg/dL (ref 6–20)
CO2: 22 mmol/L (ref 22–32)
Calcium: 9.1 mg/dL (ref 8.9–10.3)
Chloride: 104 mmol/L (ref 98–111)
Creatinine, Ser: 0.48 mg/dL (ref 0.44–1.00)
GFR, Estimated: >60 mL/min (ref >60)
Glucose, Bld: 94 mg/dL (ref 70–99)
Potassium: 3.6 mmol/L (ref 3.5–5.1)
Sodium: 135 mmol/L (ref 135–145)
Total Bilirubin: 0.8 mg/dL (ref 0.0–1.2)
Total Protein: 6.9 g/dL (ref 6.5–8.1)

## 2023-12-26 LAB — CBC
HCT: 40.4 % (ref 36.0–46.0)
Hemoglobin: 13.2 g/dL (ref 12.0–15.0)
MCH: 30.3 pg (ref 26.0–34.0)
MCHC: 32.7 g/dL (ref 30.0–36.0)
MCV: 92.7 fL (ref 80.0–100.0)
Platelets: 309 10*3/uL (ref 150–400)
RBC: 4.36 MIL/uL (ref 3.87–5.11)
RDW: 14.5 % (ref 11.5–15.5)
WBC: 16.8 10*3/uL — ABNORMAL HIGH (ref 4.0–10.5)
nRBC: 0 % (ref 0.0–0.2)

## 2023-12-26 LAB — PREGNANCY, URINE: Preg Test, Ur: POSITIVE — AB

## 2023-12-26 LAB — TROPONIN I (HIGH SENSITIVITY): Troponin I (High Sensitivity): 3 ng/L (ref <18)

## 2023-12-26 LAB — HCG, QUANTITATIVE, PREGNANCY: hCG, Beta Chain, Quant, S: 9 m[IU]/mL — ABNORMAL HIGH (ref <5)

## 2023-12-26 LAB — CBG MONITORING, ED: Glucose-Capillary: 170 mg/dL — ABNORMAL HIGH (ref 70–99)

## 2023-12-26 MED ORDER — OXYCODONE HCL 10 MG PO TABS: 10.0000 mg | ORAL_TABLET | Freq: Four times a day (QID) | ORAL | Status: DC | PRN

## 2023-12-26 MED ORDER — MONTELUKAST SODIUM 10 MG PO TABS
10.0000 mg | ORAL_TABLET | Freq: Every day | ORAL | Status: DC
  Filled 2023-12-26: qty 1

## 2023-12-26 MED ORDER — FLUOXETINE HCL 20 MG PO CAPS
20.0000 mg | ORAL_CAPSULE | Freq: Every day | ORAL | Status: DC
  Filled 2023-12-26: qty 1

## 2023-12-26 MED ORDER — METHOCARBAMOL 500 MG PO TABS
500.0000 mg | ORAL_TABLET | Freq: Three times a day (TID) | ORAL | Status: DC | PRN
  Administered 2023-12-27 – 2023-12-31 (×8): 500 mg via ORAL
  Filled 2023-12-26 (×8): qty 1

## 2023-12-26 MED ORDER — IPRATROPIUM-ALBUTEROL 0.5-2.5 (3) MG/3ML IN SOLN
3.0000 mL | Freq: Once | RESPIRATORY_TRACT | Status: AC
  Administered 2023-12-26: 3 mL via RESPIRATORY_TRACT
  Filled 2023-12-26: qty 3

## 2023-12-26 MED ORDER — PROPRANOLOL HCL 10 MG PO TABS
10.0000 mg | ORAL_TABLET | Freq: Two times a day (BID) | ORAL | Status: DC
  Administered 2023-12-27 – 2023-12-28 (×3): 10 mg via ORAL
  Filled 2023-12-26 (×3): qty 1

## 2023-12-26 MED ORDER — ALBUTEROL SULFATE HFA 108 (90 BASE) MCG/ACT IN AERS: 2.0000 | INHALATION_SPRAY | RESPIRATORY_TRACT | Status: DC | PRN

## 2023-12-26 MED ORDER — PRENATAL MULTIVITAMIN CH
1.0000 | ORAL_TABLET | Freq: Every day | ORAL | Status: DC
  Administered 2023-12-27 – 2023-12-31 (×5): 1 via ORAL
  Filled 2023-12-26 (×5): qty 1

## 2023-12-26 MED ORDER — TRAZODONE HCL 100 MG PO TABS
100.0000 mg | ORAL_TABLET | Freq: Every day | ORAL | Status: DC
  Administered 2023-12-26 – 2023-12-30 (×5): 100 mg via ORAL
  Filled 2023-12-26 (×5): qty 1

## 2023-12-26 MED ORDER — IPRATROPIUM-ALBUTEROL 0.5-2.5 (3) MG/3ML IN SOLN
3.0000 mL | RESPIRATORY_TRACT | Status: DC
  Administered 2023-12-26 – 2023-12-27 (×4): 3 mL via RESPIRATORY_TRACT
  Filled 2023-12-26 (×3): qty 3

## 2023-12-26 MED ORDER — HYDROXYZINE HCL 25 MG PO TABS
25.0000 mg | ORAL_TABLET | Freq: Three times a day (TID) | ORAL | Status: DC | PRN
  Administered 2023-12-27 – 2023-12-29 (×2): 25 mg via ORAL
  Filled 2023-12-26 (×2): qty 1

## 2023-12-26 MED ORDER — HYDROCOD POLI-CHLORPHE POLI ER 10-8 MG/5ML PO SUER
5.0000 mL | Freq: Two times a day (BID) | ORAL | Status: DC
  Administered 2023-12-26 – 2023-12-31 (×10): 5 mL via ORAL
  Filled 2023-12-26 (×10): qty 5

## 2023-12-26 MED ORDER — ONDANSETRON HCL 4 MG/2ML IJ SOLN: 4.0000 mg | Freq: Three times a day (TID) | INTRAMUSCULAR | Status: DC | PRN

## 2023-12-26 MED ORDER — OXYCODONE HCL 5 MG PO TABS
10.0000 mg | ORAL_TABLET | Freq: Four times a day (QID) | ORAL | Status: DC | PRN
  Administered 2023-12-27 – 2023-12-31 (×13): 10 mg via ORAL
  Filled 2023-12-26 (×14): qty 2

## 2023-12-26 MED ORDER — INSULIN ASPART 100 UNIT/ML IJ SOLN: 0.0000 [IU] | Freq: Every day | INTRAMUSCULAR | Status: DC

## 2023-12-26 MED ORDER — ENOXAPARIN SODIUM 40 MG/0.4ML IJ SOSY
40.0000 mg | PREFILLED_SYRINGE | INTRAMUSCULAR | Status: DC
  Administered 2023-12-26 – 2023-12-30 (×5): 40 mg via SUBCUTANEOUS
  Filled 2023-12-26 (×5): qty 0.4

## 2023-12-26 MED ORDER — HYDRALAZINE HCL 20 MG/ML IJ SOLN: 5.0000 mg | INTRAMUSCULAR | Status: DC | PRN

## 2023-12-26 MED ORDER — METHYLPREDNISOLONE SODIUM SUCC 125 MG IJ SOLR
80.0000 mg | INTRAMUSCULAR | Status: DC
  Administered 2023-12-27: 80 mg via INTRAVENOUS
  Filled 2023-12-26: qty 2

## 2023-12-26 MED ORDER — FLUOXETINE HCL 20 MG PO CAPS: 20.0000 mg | ORAL_CAPSULE | Freq: Every day | ORAL | Status: DC

## 2023-12-26 MED ORDER — CLONAZEPAM 1 MG PO TABS
1.0000 mg | ORAL_TABLET | Freq: Two times a day (BID) | ORAL | Status: DC | PRN
  Administered 2023-12-27 – 2023-12-31 (×7): 1 mg via ORAL
  Filled 2023-12-26 (×8): qty 1

## 2023-12-26 MED ORDER — MONTELUKAST SODIUM 10 MG PO TABS
10.0000 mg | ORAL_TABLET | Freq: Every day | ORAL | Status: DC
  Administered 2023-12-27 – 2023-12-31 (×5): 10 mg via ORAL
  Filled 2023-12-26 (×5): qty 1

## 2023-12-26 MED ORDER — METHYLPREDNISOLONE SODIUM SUCC 125 MG IJ SOLR
125.0000 mg | Freq: Once | INTRAMUSCULAR | Status: AC
  Administered 2023-12-26: 125 mg via INTRAVENOUS
  Filled 2023-12-26: qty 2

## 2023-12-26 MED ORDER — PANTOPRAZOLE SODIUM 40 MG PO TBEC
40.0000 mg | DELAYED_RELEASE_TABLET | Freq: Every day | ORAL | Status: DC
  Administered 2023-12-27 – 2023-12-31 (×5): 40 mg via ORAL
  Filled 2023-12-26 (×5): qty 1

## 2023-12-26 MED ORDER — KETOROLAC TROMETHAMINE 30 MG/ML IJ SOLN
30.0000 mg | Freq: Once | INTRAMUSCULAR | Status: AC
  Administered 2023-12-26: 30 mg via INTRAVENOUS
  Filled 2023-12-26: qty 1

## 2023-12-26 MED ORDER — ACETAMINOPHEN 325 MG PO TABS: 650.0000 mg | ORAL_TABLET | Freq: Four times a day (QID) | ORAL | Status: DC | PRN

## 2023-12-26 MED ORDER — INSULIN ASPART 100 UNIT/ML IJ SOLN
0.0000 [IU] | Freq: Three times a day (TID) | INTRAMUSCULAR | Status: DC
  Administered 2023-12-27 (×2): 2 [IU] via SUBCUTANEOUS
  Administered 2023-12-27: 3 [IU] via SUBCUTANEOUS
  Administered 2023-12-28: 1 [IU] via SUBCUTANEOUS
  Administered 2023-12-28 – 2023-12-29 (×4): 2 [IU] via SUBCUTANEOUS
  Administered 2023-12-30 (×2): 1 [IU] via SUBCUTANEOUS
  Administered 2023-12-30: 2 [IU] via SUBCUTANEOUS
  Administered 2023-12-31: 1 [IU] via SUBCUTANEOUS
  Filled 2023-12-26 (×13): qty 1

## 2023-12-26 MED ORDER — DM-GUAIFENESIN ER 30-600 MG PO TB12: 1.0000 | ORAL_TABLET | Freq: Two times a day (BID) | ORAL | Status: DC | PRN

## 2023-12-26 NOTE — ED Triage Notes (Signed)
 Patient states shortness of breath x one month; worse last night and today.

## 2023-12-26 NOTE — ED Provider Notes (Signed)
 [facility] Emergency Department Provider Note     Event Date/Time   First MD Initiated Contact with Patient 12/26/23 1635     (approximate)   History   Shortness of Breath   HPI  Stacey Pope is a 38 y.o. female with a history of asthma, HTN and diabetes mellitus presents to the ED for evaluation of persistent wheezing.  Patient reports increased effort of breathing has been going on for 1 month, but is worse last night and today. She reports these flares occurs every summer. Associated symptoms include chest tightness however she denies chest pain. Has been a two course of prednisone  and antibiotics. Denies sick contacts, nausea, vomiting and fever.  Chart reviewed -last PCP visit 12/24/23 for asthma flare for severe persistent asthma with acute exacerbation.     Physical Exam   Triage Vital Signs: ED Triage Vitals [12/26/23 1506]  Encounter Vitals Group     BP 117/80     Girls Systolic BP Percentile      Girls Diastolic BP Percentile      Boys Systolic BP Percentile      Boys Diastolic BP Percentile      Pulse Rate 85     Resp 18     Temp 98.1 F (36.7 C)     Temp Source Oral     SpO2 98 %     Weight 220 lb (99.8 kg)     Height 5' 6 (1.676 m)     Head Circumference      Peak Flow      Pain Score 8     Pain Loc      Pain Education      Exclude from Growth Chart     Most recent vital signs: Vitals:   12/26/23 2100 12/26/23 2156  BP: 135/73   Pulse: 76   Resp:    Temp:  97.7 F (36.5 C)  SpO2:      General Awake, no distress.  Well-appearing.  Sitting comfortably in evaluation bed. HEENT NCAT.  CV:  Good peripheral perfusion.  RRR RESP:  Normal effort.  Bilateral inspiratory wheezing on auscultation. ABD:  No distention.   ED Results / Procedures / Treatments   Labs (all labs ordered are listed, but only abnormal results are displayed) Labs Reviewed  CBC - Abnormal; Notable for the following components:      Result  Value   WBC 16.8 (*)    All other components within normal limits  COMPREHENSIVE METABOLIC PANEL WITH GFR - Abnormal; Notable for the following components:   AST 12 (*)    All other components within normal limits  PREGNANCY, URINE - Abnormal; Notable for the following components:   Preg Test, Ur POSITIVE (*)    All other components within normal limits  HCG, QUANTITATIVE, PREGNANCY - Abnormal; Notable for the following components:   hCG, Beta Chain, Quant, S 9 (*)    All other components within normal limits  CBG MONITORING, ED - Abnormal; Notable for the following components:   Glucose-Capillary 170 (*)    All other components within normal limits  RESP PANEL BY RT-PCR (RSV, FLU A&B, COVID)  RVPGX2  HIV ANTIBODY (ROUTINE TESTING W REFLEX)  CBC  TROPONIN I (HIGH SENSITIVITY)    RADIOLOGY  I personally viewed and evaluated these images as part of my medical decision making, as well as reviewing the written report by the radiologist.  ED Provider Interpretation: No focal consolidation.  DG Chest 2 View Result Date: 12/26/2023 CLINICAL DATA:  wheezing and dyspnea EXAM: CHEST - 2 VIEW COMPARISON:  None available. FINDINGS: No focal airspace consolidation, pleural effusion, or pneumothorax. No cardiomegaly. No acute fracture or destructive lesion. IMPRESSION: No acute cardiopulmonary abnormality. Electronically Signed   By: Rance Burrows M.D.   On: 12/26/2023 17:56    PROCEDURES:  Critical Care performed: No  Procedures   MEDICATIONS ORDERED IN ED: Medications  methylPREDNISolone  sodium succinate (SOLU-MEDROL ) 125 mg/2 mL injection 80 mg (has no administration in time range)  ipratropium-albuterol  (DUONEB) 0.5-2.5 (3) MG/3ML nebulizer solution 3 mL (3 mLs Nebulization Given 12/26/23 2334)  albuterol  (VENTOLIN  HFA) 108 (90 Base) MCG/ACT inhaler 2 puff (has no administration in time range)  dextromethorphan -guaiFENesin  (MUCINEX  DM) 30-600 MG per 12 hr tablet 1 tablet (has no  administration in time range)  ondansetron  (ZOFRAN ) injection 4 mg (has no administration in time range)  hydrALAZINE  (APRESOLINE ) injection 5 mg (has no administration in time range)  acetaminophen  (TYLENOL ) tablet 650 mg (has no administration in time range)  enoxaparin  (LOVENOX ) injection 40 mg (40 mg Subcutaneous Given 12/26/23 2200)  insulin aspart (novoLOG) injection 0-9 Units (has no administration in time range)  insulin aspart (novoLOG) injection 0-5 Units ( Subcutaneous Not Given 12/26/23 2159)  chlorpheniramine-HYDROcodone  (TUSSIONEX) 10-8 MG/5ML suspension 5 mL (5 mLs Oral Given 12/26/23 2159)  propranolol (INDERAL) tablet 10 mg (has no administration in time range)  hydrOXYzine (ATARAX) tablet 25 mg (has no administration in time range)  traZODone  (DESYREL ) tablet 100 mg (100 mg Oral Given 12/26/23 2200)  pantoprazole  (PROTONIX ) EC tablet 40 mg (has no administration in time range)  clonazePAM (KLONOPIN) tablet 1 mg (has no administration in time range)  oxyCODONE  (Oxy IR/ROXICODONE ) immediate release tablet 10 mg (has no administration in time range)  methocarbamol  (ROBAXIN ) tablet 500 mg (has no administration in time range)  FLUoxetine (PROZAC) capsule 20 mg (has no administration in time range)  montelukast (SINGULAIR) tablet 10 mg (has no administration in time range)  prenatal vitamin w/FE, FA (NATACHEW) chewable tablet 1 tablet (has no administration in time range)  ipratropium-albuterol  (DUONEB) 0.5-2.5 (3) MG/3ML nebulizer solution 3 mL (3 mLs Nebulization Given 12/26/23 1647)  ipratropium-albuterol  (DUONEB) 0.5-2.5 (3) MG/3ML nebulizer solution 3 mL (3 mLs Nebulization Given 12/26/23 1801)  methylPREDNISolone  sodium succinate (SOLU-MEDROL ) 125 mg/2 mL injection 125 mg (125 mg Intravenous Given 12/26/23 1841)  ipratropium-albuterol  (DUONEB) 0.5-2.5 (3) MG/3ML nebulizer solution 3 mL (3 mLs Nebulization Given 12/26/23 1918)  ketorolac  (TORADOL ) 30 MG/ML injection 30 mg (30 mg  Intravenous Given 12/26/23 1930)     IMPRESSION / MDM / ASSESSMENT AND PLAN / ED COURSE  I reviewed the triage vital signs and the nursing notes.                               38 y.o. female presents to the emergency department for evaluation and treatment of shortness of breath. See HPI for further details.   Differential diagnosis includes, but is not limited to acute asthma exacerbation, viral URI, PNA  Patient's presentation is most consistent with acute complicated illness / injury requiring diagnostic workup.  Patient is alert and oriented.  She is hemodynamic stable.  Physical exam is pertinent for bilateral wheezing that did not improve with 3 DuoNebs treatments and Solu-Medrol .  Will obtain further workup with lab work and try to admit patient for observation.  Patient will be admitted under hospitalist team  for presentation of asthma exacerbation.  FINAL CLINICAL IMPRESSION(S) / ED DIAGNOSES   Final diagnoses:  None     Rx / DC Orders   ED Discharge Orders     None        Note:  This document was prepared using Dragon voice recognition software and may include unintentional dictation errors.    Phyllis Breeze, Belita Warsame A, PA-C 12/27/23 0005    Lubertha Rush, MD 12/27/23 [phone]

## 2023-12-26 NOTE — H&P (Addendum)
 History and Physical    Stacey Pope:[id number] DOB: 1986-02-05 DOA: 12/26/2023  Referring MD/NP/PA:   PCP: Tester, Hillary A, PA-C   Patient coming from:  The patient is coming from home.     Chief Complaint: SOB, cough, wheezing  HPI: Stacey Pope is a 38 y.o. female with medical history significant of asthma, HTN, DM,, GERD, depression with anxiety, obesity, former smoker, chronic back pain with sciatica, who presents with cough, SOB and wheezing.  Patient states that she has SOB for more than 1 week which has worsened since last night.  She has severe dry cough and chest tightness, no active chest pain, fever or chills.  She states that she completed 2 course of antibiotics and 3 course of prednisone  treatment without significant improvement.  She just finished a course of Z-Pak last week.  She has nausea, no vomiting, diarrhea or abdominal pain.  No symptoms of UTI.  Data reviewed independently and ED Course: pt was found to have WBC 16.8, GFR> 60, negative PCR for COVID, flu and RSV, troponin 3, temperature normal, blood pressure 132/67, heart rate 85, RR 20, oxygen saturation 95% on room air.  Chest x-ray negative for infiltration.  Patient is placed in MedSurg bed for observation.   EKG: I have personally reviewed.  Sinus rhythm, QTc 446, LAD, poor R wave progression   Review of Systems:   General: no fevers, chills, no body weight gain, has fatigue HEENT: no blurry vision, hearing changes or sore throat Respiratory: has dyspnea, coughing, wheezing CV: no chest pain, no palpitations GI: no nausea, vomiting, abdominal pain, diarrhea, constipation GU: no dysuria, burning on urination, increased urinary frequency, hematuria  Ext: no leg edema Neuro: no unilateral weakness, numbness, or tingling, no vision change or hearing loss Skin: no rash, no skin tear. MSK: No muscle spasm, no deformity, no limitation of range of movement in spin Heme: No easy bruising.  Travel  history: No recent long distant travel.   Allergy:  Allergies  Allergen Reactions   Amoxicillin Rash   Augmentin [Amoxicillin-Pot Clavulanate] Anaphylaxis   Azithromycin  Rash   Clavulanic Acid     Past Medical History:  Diagnosis Date   Arthritis    Asthma    Diabetes mellitus without complication (HCC)    Herniated lumbar intervertebral disc    Hypertension     Past Surgical History:  Procedure Laterality Date   BACK SURGERY Right     Social History:  reports that she has quit smoking. Her smoking use included cigars. She has never used smokeless tobacco. She reports that she does not drink alcohol and does not use drugs.  Family History:  Family History  Problem Relation Age of Onset   Hypertension Mother    Diabetes Mother    Lung cancer Mother    Heart disease Father    Hypertension Sister    Diabetes Sister      Prior to Admission medications   Medication Sig Start Date End Date Taking? Authorizing Provider  albuterol  (VENTOLIN  HFA) 108 (90 Base) MCG/ACT inhaler Inhale into the lungs. 01/13/21   [provider]  amLODipine (NORVASC) 5 MG tablet Take 5 mg by mouth daily. Patient not taking: Reported on 11/22/2021 08/15/21   [provider]  buPROPion (WELLBUTRIN XL) 150 MG 24 hr tablet Take 150 mg by mouth daily. 07/18/21   [provider]  dextromethorphan -guaiFENesin  (MUCINEX  DM) 30-600 MG 12hr tablet Take 1 tablet by mouth 2 (two) times daily. 11/23/21  Luna Salinas, MD  EPINEPHrine  0.3 mg/0.3 mL IJ SOAJ injection Inject into the muscle. 08/09/20   [provider]  ergocalciferol (VITAMIN D2) 1.25 MG (50000 UT) capsule Take by mouth. 07/27/21   [provider]  fluticasone -salmeterol (ADVAIR) 250-50 MCG/ACT AEPB INHALE 1 DOSE BY MOUTH TWICE DAILY 09/06/21   [provider]  ipratropium-albuterol  (DUONEB) 0.5-2.5 (3) MG/3ML SOLN Take 3 mLs by nebulization every 4 (four) hours as needed. 11/23/21   Luna Salinas, MD   lidocaine  (LIDODERM ) 5 % Place 1 patch onto the skin daily. Remove & Discard patch within 12 hours or as directed by MD 11/23/21   Amin, Sumayya, MD  loratadine (CLARITIN) 10 MG tablet Take by mouth.    [provider]  nicotine  (NICODERM CQ  - DOSED IN MG/24 HOURS) 14 mg/24hr patch Place 1 patch (14 mg total) onto the skin daily. 11/24/21   Amin, Sumayya, MD  omeprazole (PRILOSEC) 10 MG capsule Take 10 mg by mouth daily.    [provider]  oxyCODONE -acetaminophen  (PERCOCET/ROXICET) 5-325 MG tablet Take 1 tablet by mouth every 6 (six) hours as needed for severe pain. 11/23/21   Amin, Sumayya, MD  polyethylene glycol (MIRALAX  / GLYCOLAX ) 17 g packet Take 17 g by mouth daily as needed for mild constipation. 11/23/21   Amin, Sumayya, MD  topiramate  (TOPAMAX ) 25 MG tablet Take 25 mg by mouth at bedtime. 09/06/21   [provider]    Physical Exam: Vitals:   12/26/23 1506 12/26/23 1917  BP: 117/80 132/67  Pulse: 85 80  Resp: 18 20  Temp: 98.1 F (36.7 C) 97.8 F (36.6 C)  TempSrc: Oral Oral  SpO2: 98% 95%  Weight: 99.8 kg   Height: 5' 6 (1.676 m)    General: Not in acute distress HEENT:       Eyes: PERRL, EOMI, no jaundice       ENT: No discharge from the ears and nose, no pharynx injection, no tonsillar enlargement.        Neck: No JVD, no bruit, no mass felt. Heme: No neck lymph node enlargement. Cardiac: S1/S2, RRR, No murmurs, No gallops or rubs. Respiratory: Has coarse breathing sound bilaterally, has wheezing bilaterally GI: Soft, nondistended, nontender, no rebound pain, no organomegaly, BS present. GU: No hematuria Ext: No pitting leg edema bilaterally. 1+DP/PT pulse bilaterally. Musculoskeletal: No joint deformities, No joint redness or warmth, no limitation of ROM in spin. Skin: No rashes.  Neuro: Alert, oriented X3, cranial nerves II-XII grossly intact, moves all extremities normally.  Psych: Patient is not psychotic, no suicidal or hemocidal  ideation.  Labs on Admission: I have personally reviewed following labs and imaging studies  CBC: Recent Labs  Lab 12/26/23 1836  WBC 16.8*  HGB 13.2  HCT 40.4  MCV 92.7  PLT 309   Basic Metabolic Panel: Recent Labs  Lab 12/26/23 1836  NA 135  K 3.6  CL 104  CO2 22  GLUCOSE 94  BUN 9  CREATININE 0.48  CALCIUM 9.1   GFR: Estimated Creatinine Clearance: 114.8 mL/min (by C-G formula based on SCr of 0.48 mg/dL). Liver Function Tests: Recent Labs  Lab 12/26/23 1836  AST 12*  ALT 20  ALKPHOS 77  BILITOT 0.8  PROT 6.9  ALBUMIN 3.6   No results for input(s): LIPASE, AMYLASE in the last 168 hours. No results for input(s): AMMONIA in the last 168 hours. Coagulation Profile: No results for input(s): INR, PROTIME in the last 168 hours. Cardiac Enzymes: No results for  input(s): CKTOTAL, CKMB, CKMBINDEX, TROPONINI in the last 168 hours. BNP (last 3 results) No results for input(s): PROBNP in the last 8760 hours. HbA1C: No results for input(s): HGBA1C in the last 72 hours. CBG: Recent Labs  Lab 12/26/23 2139  GLUCAP 170*   Lipid Profile: No results for input(s): CHOL, HDL, LDLCALC, TRIG, CHOLHDL, LDLDIRECT in the last 72 hours. Thyroid Function Tests: No results for input(s): TSH, T4TOTAL, FREET4, T3FREE, THYROIDAB in the last 72 hours. Anemia Panel: No results for input(s): VITAMINB12, FOLATE, FERRITIN, TIBC, IRON, RETICCTPCT in the last 72 hours. Urine analysis:    Component Value Date/Time   COLORURINE YELLOW (A) 11/21/2021 1900   APPEARANCEUR HAZY (A) 11/21/2021 1900   APPEARANCEUR Cloudy 03/15/2014 0024   LABSPEC 1.011 11/21/2021 1900   LABSPEC 1.032 03/15/2014 0024   PHURINE 6.0 11/21/2021 1900   GLUCOSEU NEGATIVE 11/21/2021 1900   GLUCOSEU Negative 03/15/2014 0024   HGBUR NEGATIVE 11/21/2021 1900   BILIRUBINUR NEGATIVE 11/21/2021 1900   BILIRUBINUR Negative 03/15/2014 0024   KETONESUR NEGATIVE  11/21/2021 1900   PROTEINUR NEGATIVE 11/21/2021 1900   NITRITE NEGATIVE 11/21/2021 1900   LEUKOCYTESUR NEGATIVE 11/21/2021 1900   LEUKOCYTESUR 3+ 03/15/2014 0024   Sepsis Labs: @LABRCNTIP (procalcitonin:4,lacticidven:4) ) Recent Results (from the past 240 hours)  Resp panel by RT-PCR (RSV, Flu A&B, Covid) Anterior Nasal Swab     Status: None   Collection Time: 12/26/23  4:48 PM   Specimen: Anterior Nasal Swab  Result Value Ref Range Status   SARS Coronavirus 2 by RT PCR NEGATIVE NEGATIVE Final    Comment: (NOTE) SARS-CoV-2 target nucleic acids are NOT DETECTED.  The SARS-CoV-2 RNA is generally detectable in upper respiratory specimens during the acute phase of infection. The lowest concentration of SARS-CoV-2 viral copies this assay can detect is 138 copies/mL. A negative result does not preclude SARS-Cov-2 infection and should not be used as the sole basis for treatment or other patient management decisions. A negative result may occur with  improper specimen collection/handling, submission of specimen other than nasopharyngeal swab, presence of viral mutation(s) within the areas targeted by this assay, and inadequate number of viral copies(<138 copies/mL). A negative result must be combined with clinical observations, patient history, and epidemiological information. The expected result is Negative.  Fact Sheet for Patients:  BloggerCourse.com  Fact Sheet for Healthcare Providers:  SeriousBroker.it  This test is no t yet approved or cleared by the [place]  FDA and  has been authorized for detection and/or diagnosis of SARS-CoV-2 by FDA under an Emergency Use Authorization (EUA). This EUA will remain  in effect (meaning this test can be used) for the duration of the COVID-19 declaration under Section 564(b)(1) of the Act, 21 U.S.C.section 360bbb-3(b)(1), unless the authorization is terminated  or revoked sooner.        Influenza A by PCR NEGATIVE NEGATIVE Final   Influenza B by PCR NEGATIVE NEGATIVE Final    Comment: (NOTE) The Xpert Xpress SARS-CoV-2/FLU/RSV plus assay is intended as an aid in the diagnosis of influenza from Nasopharyngeal swab specimens and should not be used as a sole basis for treatment. Nasal washings and aspirates are unacceptable for Xpert Xpress SARS-CoV-2/FLU/RSV testing.  Fact Sheet for Patients: BloggerCourse.com  Fact Sheet for Healthcare Providers: SeriousBroker.it  This test is not yet approved or cleared by the [place]  FDA and has been authorized for detection and/or diagnosis of SARS-CoV-2 by FDA under an Emergency Use Authorization (EUA). This EUA will remain in effect (meaning this test can  be used) for the duration of the COVID-19 declaration under Section 564(b)(1) of the Act, 21 U.S.C. section 360bbb-3(b)(1), unless the authorization is terminated or revoked.     Resp Syncytial Virus by PCR NEGATIVE NEGATIVE Final    Comment: (NOTE) Fact Sheet for Patients: BloggerCourse.com  Fact Sheet for Healthcare Providers: SeriousBroker.it  This test is not yet approved or cleared by the [place]  FDA and has been authorized for detection and/or diagnosis of SARS-CoV-2 by FDA under an Emergency Use Authorization (EUA). This EUA will remain in effect (meaning this test can be used) for the duration of the COVID-19 declaration under Section 564(b)(1) of the Act, 21 U.S.C. section 360bbb-3(b)(1), unless the authorization is terminated or revoked.  Performed at [facility], [address], [place], [place] [address]      Radiological Exams on Admission:   Assessment/Plan Principal Problem:   Asthma exacerbation Active Problems:   HTN (hypertension)   Diabetes mellitus without complication (HCC)   Leukocytosis   Low back pain due to  bilateral sciatica   Obesity (BMI 30-39.9)   Anxiety and depression   Assessment and Plan:  Asthma exacerbation: No oxygen desaturation, chest x-ray without infiltration.  Patient has severe dry cough.  No fever or chills.  -Place in tele bed for obs -Bronchodilators and prn Mucinex  -Singulair -Solu-Medrol  80 mg IV daily after given 125 mg of Solu-Medrol  -Start Tussionex twice daily for severe cough  HTN (hypertension) -IV hydralazine  as needed - Propranolol  Diabetes mellitus without complication (HCC): Recent A1c 5.9, well-controlled.  Patient is taking Mounjaro, Jardiance -SSI  Leukocytosis: WBC 16.8, likely due to recent steroid use, no fever, no signs of infection. -Follow-up with CBC  Low back pain due to bilateral sciatica -Continue home as needed oxycodone  and Robaxin   Obesity (BMI 30-39.9): Patient has Obesity Class II, with body weight 99.8 Kg and BMI 35.51 kg/m2.  - Encourage losing weight - Exercise and healthy diet  Anxiety and depression - Continue home medications  Addendum: urine preg test comes back positive. She had a miscarriage in April [redacted] weeks gestation. -will get hCG quantitative pregnancy test - Start prenatal vitamin    DVT ppx: SQ Lovenox   Code Status: Full code   Family Communication:     not done, no family member is at bed side.     Disposition Plan:  Anticipate discharge back to previous environment  Consults called:  none  Admission status and Level of care: Med-Surg:    for obs as inpt        Dispo: The patient is from: Home              Anticipated d/c is to: Home              Anticipated d/c date is: 1 day              Patient currently is not medically stable to d/c.    Severity of Illness:  The appropriate patient status for this patient is OBSERVATION. Observation status is judged to be reasonable and necessary in order to provide the required intensity of service to ensure the patient's safety. The patient's  presenting symptoms, physical exam findings, and initial radiographic and laboratory data in the context of their medical condition is felt to place them at decreased risk for further clinical deterioration. Furthermore, it is anticipated that the patient will be medically stable for discharge from the hospital within 2 midnights of admission.  Date of Service 12/26/2023    Fidencio Hue [facility]   If 7PM-7AM, please contact night-coverage [email or web address] 12/26/2023, 9:50 PM

## 2023-12-27 ENCOUNTER — Encounter: Payer: Self-pay | Admitting: Internal Medicine

## 2023-12-27 DIAGNOSIS — E559 Vitamin D deficiency, unspecified: Secondary | ICD-10-CM | POA: Diagnosis present

## 2023-12-27 DIAGNOSIS — M5431 Sciatica, right side: Secondary | ICD-10-CM | POA: Diagnosis present

## 2023-12-27 DIAGNOSIS — Z833 Family history of diabetes mellitus: Secondary | ICD-10-CM | POA: Diagnosis not present

## 2023-12-27 DIAGNOSIS — J4541 Moderate persistent asthma with (acute) exacerbation: Secondary | ICD-10-CM | POA: Diagnosis not present

## 2023-12-27 DIAGNOSIS — E66812 Obesity, class 2: Secondary | ICD-10-CM | POA: Diagnosis present

## 2023-12-27 DIAGNOSIS — Z880 Allergy status to penicillin: Secondary | ICD-10-CM | POA: Diagnosis not present

## 2023-12-27 DIAGNOSIS — R110 Nausea: Secondary | ICD-10-CM | POA: Diagnosis present

## 2023-12-27 DIAGNOSIS — I10 Essential (primary) hypertension: Secondary | ICD-10-CM | POA: Diagnosis not present

## 2023-12-27 DIAGNOSIS — Z1152 Encounter for screening for COVID-19: Secondary | ICD-10-CM | POA: Diagnosis not present

## 2023-12-27 DIAGNOSIS — Z6835 Body mass index (BMI) 35.0-35.9, adult: Secondary | ICD-10-CM | POA: Diagnosis not present

## 2023-12-27 DIAGNOSIS — F419 Anxiety disorder, unspecified: Secondary | ICD-10-CM | POA: Diagnosis present

## 2023-12-27 DIAGNOSIS — T380X5A Adverse effect of glucocorticoids and synthetic analogues, initial encounter: Secondary | ICD-10-CM | POA: Diagnosis present

## 2023-12-27 DIAGNOSIS — G8929 Other chronic pain: Secondary | ICD-10-CM | POA: Diagnosis present

## 2023-12-27 DIAGNOSIS — Z87891 Personal history of nicotine dependence: Secondary | ICD-10-CM | POA: Diagnosis not present

## 2023-12-27 DIAGNOSIS — D72829 Elevated white blood cell count, unspecified: Secondary | ICD-10-CM | POA: Diagnosis present

## 2023-12-27 DIAGNOSIS — F32A Depression, unspecified: Secondary | ICD-10-CM | POA: Diagnosis present

## 2023-12-27 DIAGNOSIS — J4551 Severe persistent asthma with (acute) exacerbation: Secondary | ICD-10-CM | POA: Diagnosis present

## 2023-12-27 DIAGNOSIS — Z7951 Long term (current) use of inhaled steroids: Secondary | ICD-10-CM | POA: Diagnosis not present

## 2023-12-27 DIAGNOSIS — Z87892 Personal history of anaphylaxis: Secondary | ICD-10-CM | POA: Diagnosis not present

## 2023-12-27 DIAGNOSIS — Z7952 Long term (current) use of systemic steroids: Secondary | ICD-10-CM | POA: Diagnosis not present

## 2023-12-27 DIAGNOSIS — R0609 Other forms of dyspnea: Secondary | ICD-10-CM | POA: Diagnosis present

## 2023-12-27 DIAGNOSIS — Z7985 Long-term (current) use of injectable non-insulin antidiabetic drugs: Secondary | ICD-10-CM | POA: Diagnosis not present

## 2023-12-27 DIAGNOSIS — Z8249 Family history of ischemic heart disease and other diseases of the circulatory system: Secondary | ICD-10-CM | POA: Diagnosis not present

## 2023-12-27 DIAGNOSIS — Z79899 Other long term (current) drug therapy: Secondary | ICD-10-CM | POA: Diagnosis not present

## 2023-12-27 DIAGNOSIS — Z881 Allergy status to other antibiotic agents status: Secondary | ICD-10-CM | POA: Diagnosis not present

## 2023-12-27 DIAGNOSIS — E119 Type 2 diabetes mellitus without complications: Secondary | ICD-10-CM | POA: Diagnosis not present

## 2023-12-27 DIAGNOSIS — K219 Gastro-esophageal reflux disease without esophagitis: Secondary | ICD-10-CM | POA: Diagnosis present

## 2023-12-27 LAB — GLUCOSE, CAPILLARY
Glucose-Capillary: 166 mg/dL — ABNORMAL HIGH (ref 70–99)
Glucose-Capillary: 238 mg/dL — ABNORMAL HIGH (ref 70–99)
Glucose-Capillary: 179 mg/dL — ABNORMAL HIGH (ref 70–99)
Glucose-Capillary: 152 mg/dL — ABNORMAL HIGH (ref 70–99)

## 2023-12-27 LAB — CBC
HCT: 39.8 % (ref 36.0–46.0)
Hemoglobin: 13 g/dL (ref 12.0–15.0)
MCH: 30 pg (ref 26.0–34.0)
MCHC: 32.7 g/dL (ref 30.0–36.0)
MCV: 91.7 fL (ref 80.0–100.0)
Platelets: 308 10*3/uL (ref 150–400)
RBC: 4.34 MIL/uL (ref 3.87–5.11)
RDW: 14.3 % (ref 11.5–15.5)
WBC: 11.3 10*3/uL — ABNORMAL HIGH (ref 4.0–10.5)
nRBC: 0 % (ref 0.0–0.2)

## 2023-12-27 LAB — HIV ANTIBODY (ROUTINE TESTING W REFLEX): HIV Screen 4th Generation wRfx: NONREACTIVE

## 2023-12-27 MED ORDER — BUDESONIDE 0.25 MG/2ML IN SUSP
0.2500 mg | Freq: Two times a day (BID) | RESPIRATORY_TRACT | Status: DC
  Administered 2023-12-27 – 2023-12-28 (×3): 0.25 mg via RESPIRATORY_TRACT
  Filled 2023-12-27 (×3): qty 2

## 2023-12-27 MED ORDER — FLUOXETINE HCL 20 MG PO CAPS
20.0000 mg | ORAL_CAPSULE | Freq: Every day | ORAL | Status: DC
  Administered 2023-12-27 – 2023-12-31 (×5): 20 mg via ORAL
  Filled 2023-12-27 (×5): qty 1

## 2023-12-27 MED ORDER — IPRATROPIUM-ALBUTEROL 0.5-2.5 (3) MG/3ML IN SOLN
3.0000 mL | Freq: Four times a day (QID) | RESPIRATORY_TRACT | Status: DC
  Administered 2023-12-27 – 2023-12-28 (×5): 3 mL via RESPIRATORY_TRACT
  Filled 2023-12-27 (×5): qty 3

## 2023-12-27 MED ORDER — METHYLPREDNISOLONE SODIUM SUCC 40 MG IJ SOLR
40.0000 mg | Freq: Two times a day (BID) | INTRAMUSCULAR | Status: DC
  Administered 2023-12-27 – 2023-12-28 (×2): 40 mg via INTRAVENOUS
  Filled 2023-12-27 (×2): qty 1

## 2023-12-27 NOTE — Plan of Care (Signed)

## 2023-12-27 NOTE — ED Provider Notes (Incomplete)
 [facility] Emergency Department Provider Note     Event Date/Time   First MD Initiated Contact with Patient 12/26/23 1635     (approximate)   History   Shortness of Breath   HPI  Stacey Pope is a 38 y.o. female with a history of asthma, HTN and diabetes mellitus presents to the ED for evaluation of persistent wheezing.  Patient reports increased effort of breathing has been going on for 1 month, but is worse last night and today. She reports these flares occurs every summer. Associated symptoms include chest tightness however she denies chest pain. Has been a two course of prednisone  and antibiotics. Denies sick contacts, nausea, vomiting and fever.  Chart reviewed -last PCP visit 12/24/23 for asthma flare for severe persistent asthma with acute exacerbation.     Physical Exam   Triage Vital Signs: ED Triage Vitals [12/26/23 1506]  Encounter Vitals Group     BP 117/80     Girls Systolic BP Percentile      Girls Diastolic BP Percentile      Boys Systolic BP Percentile      Boys Diastolic BP Percentile      Pulse Rate 85     Resp 18     Temp 98.1 F (36.7 C)     Temp Source Oral     SpO2 98 %     Weight 220 lb (99.8 kg)     Height 5' 6 (1.676 m)     Head Circumference      Peak Flow      Pain Score 8     Pain Loc      Pain Education      Exclude from Growth Chart     Most recent vital signs: Vitals:   12/26/23 2100 12/26/23 2156  BP: 135/73   Pulse: 76   Resp:    Temp:  97.7 F (36.5 C)  SpO2:      General Awake, no distress.  Well-appearing.  Sitting comfortably in evaluation bed. HEENT NCAT.  CV:  Good peripheral perfusion.  RRR RESP:  Normal effort.  Bilateral inspiratory wheezing on auscultation. ABD:  No distention.   ED Results / Procedures / Treatments   Labs (all labs ordered are listed, but only abnormal results are displayed) Labs Reviewed  CBC - Abnormal; Notable for the following components:      Result  Value   WBC 16.8 (*)    All other components within normal limits  COMPREHENSIVE METABOLIC PANEL WITH GFR - Abnormal; Notable for the following components:   AST 12 (*)    All other components within normal limits  PREGNANCY, URINE - Abnormal; Notable for the following components:   Preg Test, Ur POSITIVE (*)    All other components within normal limits  HCG, QUANTITATIVE, PREGNANCY - Abnormal; Notable for the following components:   hCG, Beta Chain, Quant, S 9 (*)    All other components within normal limits  CBG MONITORING, ED - Abnormal; Notable for the following components:   Glucose-Capillary 170 (*)    All other components within normal limits  RESP PANEL BY RT-PCR (RSV, FLU A&B, COVID)  RVPGX2  HIV ANTIBODY (ROUTINE TESTING W REFLEX)  CBC  TROPONIN I (HIGH SENSITIVITY)    RADIOLOGY  I personally viewed and evaluated these images as part of my medical decision making, as well as reviewing the written report by the radiologist.  ED Provider Interpretation: No focal consolidation.  DG Chest 2 View Result Date: 12/26/2023 CLINICAL DATA:  wheezing and dyspnea EXAM: CHEST - 2 VIEW COMPARISON:  None available. FINDINGS: No focal airspace consolidation, pleural effusion, or pneumothorax. No cardiomegaly. No acute fracture or destructive lesion. IMPRESSION: No acute cardiopulmonary abnormality. Electronically Signed   By: Rance Burrows M.D.   On: 12/26/2023 17:56    PROCEDURES:  Critical Care performed: No  Procedures   MEDICATIONS ORDERED IN ED: Medications  methylPREDNISolone  sodium succinate (SOLU-MEDROL ) 125 mg/2 mL injection 80 mg (has no administration in time range)  ipratropium-albuterol  (DUONEB) 0.5-2.5 (3) MG/3ML nebulizer solution 3 mL (3 mLs Nebulization Given 12/26/23 2334)  albuterol  (VENTOLIN  HFA) 108 (90 Base) MCG/ACT inhaler 2 puff (has no administration in time range)  dextromethorphan -guaiFENesin  (MUCINEX  DM) 30-600 MG per 12 hr tablet 1 tablet (has no  administration in time range)  ondansetron  (ZOFRAN ) injection 4 mg (has no administration in time range)  hydrALAZINE  (APRESOLINE ) injection 5 mg (has no administration in time range)  acetaminophen  (TYLENOL ) tablet 650 mg (has no administration in time range)  enoxaparin  (LOVENOX ) injection 40 mg (40 mg Subcutaneous Given 12/26/23 2200)  insulin aspart (novoLOG) injection 0-9 Units (has no administration in time range)  insulin aspart (novoLOG) injection 0-5 Units ( Subcutaneous Not Given 12/26/23 2159)  chlorpheniramine-HYDROcodone  (TUSSIONEX) 10-8 MG/5ML suspension 5 mL (5 mLs Oral Given 12/26/23 2159)  propranolol (INDERAL) tablet 10 mg (has no administration in time range)  hydrOXYzine (ATARAX) tablet 25 mg (has no administration in time range)  traZODone  (DESYREL ) tablet 100 mg (100 mg Oral Given 12/26/23 2200)  pantoprazole  (PROTONIX ) EC tablet 40 mg (has no administration in time range)  clonazePAM (KLONOPIN) tablet 1 mg (has no administration in time range)  oxyCODONE  (Oxy IR/ROXICODONE ) immediate release tablet 10 mg (has no administration in time range)  methocarbamol  (ROBAXIN ) tablet 500 mg (has no administration in time range)  FLUoxetine (PROZAC) capsule 20 mg (has no administration in time range)  montelukast (SINGULAIR) tablet 10 mg (has no administration in time range)  prenatal vitamin w/FE, FA (NATACHEW) chewable tablet 1 tablet (has no administration in time range)  ipratropium-albuterol  (DUONEB) 0.5-2.5 (3) MG/3ML nebulizer solution 3 mL (3 mLs Nebulization Given 12/26/23 1647)  ipratropium-albuterol  (DUONEB) 0.5-2.5 (3) MG/3ML nebulizer solution 3 mL (3 mLs Nebulization Given 12/26/23 1801)  methylPREDNISolone  sodium succinate (SOLU-MEDROL ) 125 mg/2 mL injection 125 mg (125 mg Intravenous Given 12/26/23 1841)  ipratropium-albuterol  (DUONEB) 0.5-2.5 (3) MG/3ML nebulizer solution 3 mL (3 mLs Nebulization Given 12/26/23 1918)  ketorolac  (TORADOL ) 30 MG/ML injection 30 mg (30 mg  Intravenous Given 12/26/23 1930)     IMPRESSION / MDM / ASSESSMENT AND PLAN / ED COURSE  I reviewed the triage vital signs and the nursing notes.                               38 y.o. female presents to the emergency department for evaluation and treatment of shortness of breath. See HPI for further details.   Differential diagnosis includes, but is not limited to acute asthma exacerbation, viral URI, PNA  Patient's presentation is most consistent with acute complicated illness / injury requiring diagnostic workup.   The patient is in stable and satisfactory condition for discharge home. Encouraged to follow up with *** for further management. ED precautions discussed. All questions and concerns were addressed during this ED visit.     FINAL CLINICAL IMPRESSION(S) / ED DIAGNOSES   Final diagnoses:  None  Rx / DC Orders   ED Discharge Orders     None        Note:  This document was prepared using Dragon voice recognition software and may include unintentional dictation errors.

## 2023-12-27 NOTE — Progress Notes (Signed)
 PROGRESS NOTE    Stacey Pope  WUJ:[id number] DOB: 31-Mar-1986 DOA: 12/26/2023 PCP: Tester, Cosme Dire, PA-C   Brief Narrative:    Stacey Pope is a 37 y.o. female with medical history significant of asthma, HTN, DM,, GERD, depression with anxiety, obesity, former smoker, chronic back pain with sciatica, who presents with cough, SOB and wheezing.  Patient was admitted for acute asthma exacerbation and continues to have persistent symptoms this morning, but is otherwise on room air.  Urine pregnancy test positive with minimal increase in serum hCG likely related to recent miscarriage.  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  Patient has severe dry cough.  No fever or chills. -Patient continues to remain symptomatic -Place in tele bed for obs -Bronchodilators and prn Mucinex  -Singulair -Solu-Medrol  80 mg IV daily after given 125 mg of Solu-Medrol , switch to 40 mg IV twice daily dosing -Pulmicort twice daily -Start Tussionex twice daily for severe cough   HTN (hypertension) -IV hydralazine  as needed - Propranolol   Diabetes mellitus without complication (HCC): Recent A1c 5.9, well-controlled.  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  - Encourage losing weight - Exercise and healthy diet   Anxiety and depression - Continue home medications with Prozac adjusted to a.m. dosing  Positive urine pregnancy  test -Likely in the setting of recent miscarriage in April at [redacted] weeks gestation -Serum hCG is 38 -Will confirm with gynecology, however this appears to be related to recent miscarriage -Recommend repeating level outpatient to confirm further decline     DVT prophylaxis:Lovenox  Code Status: Full Family Communication: None at bedside Disposition Plan:  Status is: Observation The patient will require care spanning > 2 midnights and should be moved to inpatient because: Need for IV medications.  Consultants:  None  Procedures:  None  Antimicrobials:  None   Subjective: Patient seen and evaluated today with ongoing symptoms of wheezing and cough and chest tightness with very minimal improvement overnight.  She does not feel at baseline.  Objective: Vitals:   12/27/23 0332 12/27/23 0450 12/27/23 0736 12/27/23 0741  BP:  111/65 (!) 141/87   Pulse:  87 82   Resp:  16 18   Temp:  97.9 F (36.6 C) 97.8 F (36.6 C)   TempSrc:  Oral Oral   SpO2: 98% 95% 97% 99%  Weight:      Height:       No intake or output data in the 24 hours ending 12/27/23 1003 Filed Weights   12/26/23 1506  Weight: 99.8 kg    Examination:  General exam: Appears calm and comfortable, obese Respiratory system: Wheezing noted bilaterally. Respiratory effort normal. Cardiovascular system: S1 & S2 heard, RRR.  Gastrointestinal system: Abdomen is soft Central nervous system: Alert and awake Extremities: No edema Skin: No significant lesions noted Psychiatry: Flat affect.    Data Reviewed: I have personally reviewed following labs and imaging studies  CBC: Recent Labs  Lab 12/26/23  1836 12/27/23 0428  WBC 16.8* 11.3*  HGB 13.2 13.0  HCT 40.4 39.8  MCV 92.7 91.7  PLT 309 308   Basic Metabolic Panel: Recent Labs  Lab 12/26/23 1836  NA 135  K 3.6  CL 104  CO2 22  GLUCOSE 94  BUN 9  CREATININE 0.48  CALCIUM 9.1   GFR: Estimated Creatinine Clearance: 114.8 mL/min (by C-G formula  based on SCr of 0.48 mg/dL). Liver Function Tests: Recent Labs  Lab 12/26/23 1836  AST 12*  ALT 20  ALKPHOS 77  BILITOT 0.8  PROT 6.9  ALBUMIN 3.6   No results for input(s): LIPASE, AMYLASE in the last 168 hours. No results for input(s): AMMONIA in the last 168 hours. Coagulation Profile: No results for input(s): INR, PROTIME in the last 168 hours. Cardiac Enzymes: No results for input(s): CKTOTAL, CKMB, CKMBINDEX, TROPONINI in the last 168 hours. BNP (last 3 results) No results for input(s): PROBNP in the last 8760 hours. HbA1C: No results for input(s): HGBA1C in the last 72 hours. CBG: Recent Labs  Lab 12/26/23 2139 12/27/23 0737  GLUCAP 170* 166*   Lipid Profile: No results for input(s): CHOL, HDL, LDLCALC, TRIG, CHOLHDL, LDLDIRECT in the last 72 hours. Thyroid Function Tests: No results for input(s): TSH, T4TOTAL, FREET4, T3FREE, THYROIDAB in the last 72 hours. Anemia Panel: No results for input(s): VITAMINB12, FOLATE, FERRITIN, TIBC, IRON, RETICCTPCT in the last 72 hours. Sepsis Labs: No results for input(s): PROCALCITON, LATICACIDVEN in the last 168 hours.  Recent Results (from the past 240 hours)  Resp panel by RT-PCR (RSV, Flu A&B, Covid) Anterior Nasal Swab     Status: None   Collection Time: 12/26/23  4:48 PM   Specimen: Anterior Nasal Swab  Result Value Ref Range Status   SARS Coronavirus 2 by RT PCR NEGATIVE NEGATIVE Final    Comment: (NOTE) SARS-CoV-2 target nucleic acids are NOT DETECTED.  The SARS-CoV-2 RNA is generally detectable in upper respiratory specimens during the acute phase of infection. The lowest concentration of SARS-CoV-2 viral copies this assay can detect is 138 copies/mL. A negative result does not preclude SARS-Cov-2 infection and should not be used as the sole basis for treatment or other patient management decisions. A negative result may occur with  improper specimen  collection/handling, submission of specimen other than nasopharyngeal swab, presence of viral mutation(s) within the areas targeted by this assay, and inadequate number of viral copies(<138 copies/mL). A negative result must be combined with clinical observations, patient history, and epidemiological information. The expected result is Negative.  Fact Sheet for Patients:  BloggerCourse.com  Fact Sheet for Healthcare Providers:  SeriousBroker.it  This test is no t yet approved or cleared by the [place]  FDA and  has been authorized for detection and/or diagnosis of SARS-CoV-2 by FDA under an Emergency Use Authorization (EUA). This EUA will remain  in effect (meaning this test can be used) for the duration of the COVID-19 declaration under Section 564(b)(1) of the Act, 21 U.S.C.section 360bbb-3(b)(1), unless the authorization is terminated  or revoked sooner.       Influenza A by PCR NEGATIVE NEGATIVE Final   Influenza B by PCR NEGATIVE NEGATIVE Final    Comment: (NOTE) The Xpert Xpress SARS-CoV-2/FLU/RSV plus assay is intended as an aid in the diagnosis of influenza from Nasopharyngeal swab specimens and should not be used as a sole basis for treatment. Nasal washings and aspirates are unacceptable for Xpert Xpress SARS-CoV-2/FLU/RSV testing.  Fact Sheet for Patients: BloggerCourse.com  Fact Sheet for Healthcare Providers: SeriousBroker.it  This test is not yet approved or cleared by the [place]  FDA and has been authorized for detection and/or diagnosis of SARS-CoV-2 by FDA under an Emergency Use Authorization (EUA). This EUA will remain in effect (meaning this test can be used) for the duration of the COVID-19 declaration under Section 564(b)(1) of the Act, 21 U.S.C. section 360bbb-3(b)(1), unless the authorization is terminated or revoked.     Resp Syncytial  Virus by PCR NEGATIVE NEGATIVE Final    Comment: (NOTE) Fact Sheet for Patients: BloggerCourse.com  Fact Sheet for Healthcare Providers: SeriousBroker.it  This test is not yet approved or cleared by the [place]  FDA and has been authorized for detection and/or diagnosis of SARS-CoV-2 by FDA under an Emergency Use Authorization (EUA). This EUA will remain in effect (meaning this test can be used) for the duration of the COVID-19 declaration under Section 564(b)(1) of the Act, 21 U.S.C. section 360bbb-3(b)(1), unless the authorization is terminated or revoked.  Performed at [facility], [address] [place], [place] [address]          Radiology Studies: DG Chest 2 View Result Date: 12/26/2023 CLINICAL DATA:  wheezing and dyspnea EXAM: CHEST - 2 VIEW COMPARISON:  None available. FINDINGS: No focal airspace consolidation, pleural effusion, or pneumothorax. No cardiomegaly. No acute fracture or destructive lesion. IMPRESSION: No acute cardiopulmonary abnormality. Electronically Signed   By: Rance Burrows M.D.   On: 12/26/2023 17:56        Scheduled Meds:  chlorpheniramine-HYDROcodone   5 mL Oral Q12H   enoxaparin  (LOVENOX ) injection  40 mg Subcutaneous Q24H   FLUoxetine  20 mg Oral Daily   insulin aspart  0-5 Units Subcutaneous QHS   insulin aspart  0-9 Units Subcutaneous TID WC   ipratropium-albuterol   3 mL Nebulization Q4H   methylPREDNISolone  (SOLU-MEDROL ) injection  80 mg Intravenous Q24H   montelukast  10 mg Oral Daily   pantoprazole   40 mg Oral Daily   prenatal multivitamin  1 tablet Oral Q1200   propranolol  10 mg Oral BID   traZODone   100 mg Oral QHS     LOS: 0 days    Time spent: 55 minutes    Doryce Mcgregory Loran Rock, DO [facility]  If 7PM-7AM, please contact night-coverage [email or web address] 12/27/2023, 10:03 AM

## 2023-12-27 NOTE — Plan of Care (Signed)

## 2023-12-28 DIAGNOSIS — J4541 Moderate persistent asthma with (acute) exacerbation: Secondary | ICD-10-CM | POA: Diagnosis not present

## 2023-12-28 LAB — BASIC METABOLIC PANEL WITH GFR
Anion gap: 9 (ref 5–15)
BUN: 16 mg/dL (ref 6–20)
CO2: 23 mmol/L (ref 22–32)
Calcium: 9.4 mg/dL (ref 8.9–10.3)
Chloride: 104 mmol/L (ref 98–111)
Creatinine, Ser: 0.49 mg/dL (ref 0.44–1.00)
GFR, Estimated: >60 mL/min (ref >60)
Glucose, Bld: 151 mg/dL — ABNORMAL HIGH (ref 70–99)
Potassium: 4.1 mmol/L (ref 3.5–5.1)
Sodium: 136 mmol/L (ref 135–145)

## 2023-12-28 LAB — GLUCOSE, CAPILLARY
Glucose-Capillary: 126 mg/dL — ABNORMAL HIGH (ref 70–99)
Glucose-Capillary: 181 mg/dL — ABNORMAL HIGH (ref 70–99)
Glucose-Capillary: 158 mg/dL — ABNORMAL HIGH (ref 70–99)
Glucose-Capillary: 191 mg/dL — ABNORMAL HIGH (ref 70–99)
Glucose-Capillary: 143 mg/dL — ABNORMAL HIGH (ref 70–99)

## 2023-12-28 LAB — CBC
HCT: 38.2 % (ref 36.0–46.0)
Hemoglobin: 12.3 g/dL (ref 12.0–15.0)
MCH: 29.7 pg (ref 26.0–34.0)
MCHC: 32.2 g/dL (ref 30.0–36.0)
MCV: 92.3 fL (ref 80.0–100.0)
Platelets: 326 10*3/uL (ref 150–400)
RBC: 4.14 MIL/uL (ref 3.87–5.11)
RDW: 14.1 % (ref 11.5–15.5)
WBC: 15.7 10*3/uL — ABNORMAL HIGH (ref 4.0–10.5)
nRBC: 0 % (ref 0.0–0.2)

## 2023-12-28 LAB — D-DIMER, QUANTITATIVE: D-Dimer, Quant: <0.27 ug{FEU}/mL (ref 0.00–0.50)

## 2023-12-28 LAB — HCG, QUANTITATIVE, PREGNANCY: hCG, Beta Chain, Quant, S: 4 m[IU]/mL (ref <5)

## 2023-12-28 LAB — PROCALCITONIN: Procalcitonin: <0.1 ng/mL

## 2023-12-28 LAB — MAGNESIUM: Magnesium: 2.6 mg/dL — ABNORMAL HIGH (ref 1.7–2.4)

## 2023-12-28 MED ORDER — BUDESONIDE 0.5 MG/2ML IN SUSP
0.5000 mg | Freq: Two times a day (BID) | RESPIRATORY_TRACT | Status: DC
  Administered 2023-12-28 – 2023-12-31 (×6): 0.5 mg via RESPIRATORY_TRACT
  Filled 2023-12-28 (×6): qty 2

## 2023-12-28 MED ORDER — ARFORMOTEROL TARTRATE 15 MCG/2ML IN NEBU
15.0000 ug | INHALATION_SOLUTION | Freq: Two times a day (BID) | RESPIRATORY_TRACT | Status: DC
  Administered 2023-12-28 – 2023-12-31 (×6): 15 ug via RESPIRATORY_TRACT
  Filled 2023-12-28 (×7): qty 2

## 2023-12-28 MED ORDER — IPRATROPIUM-ALBUTEROL 0.5-2.5 (3) MG/3ML IN SOLN
3.0000 mL | RESPIRATORY_TRACT | Status: DC | PRN
  Administered 2023-12-29: 3 mL via RESPIRATORY_TRACT
  Filled 2023-12-28: qty 3

## 2023-12-28 MED ORDER — LISINOPRIL 10 MG PO TABS
10.0000 mg | ORAL_TABLET | Freq: Every day | ORAL | Status: DC
  Administered 2023-12-28 – 2023-12-29 (×2): 10 mg via ORAL
  Filled 2023-12-28 (×2): qty 1

## 2023-12-28 MED ORDER — METHYLPREDNISOLONE SODIUM SUCC 125 MG IJ SOLR
60.0000 mg | Freq: Two times a day (BID) | INTRAMUSCULAR | Status: DC
  Administered 2023-12-28 – 2023-12-30 (×4): 60 mg via INTRAVENOUS
  Filled 2023-12-28 (×4): qty 2

## 2023-12-28 NOTE — Progress Notes (Addendum)
 PROGRESS NOTE    ALEXSANDRIA Pope  BJY:[id number] DOB: 02-Jul-1986 DOA: 12/26/2023 PCP: Tester, Cosme Dire, PA-C   Brief Narrative:    Stacey Pope is a 38 y.o. female with medical history significant of asthma, HTN, DM,, GERD, depression with anxiety, obesity, former smoker, chronic back pain with sciatica, who presents with cough, SOB and wheezing.  Patient was admitted for acute asthma exacerbation and continues to have persistent symptoms this morning, but is otherwise on room air.  Urine pregnancy test positive with minimal increase in serum hCG likely related to recent miscarriage.  Patient requesting pulmonology evaluation.  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  Patient has severe dry cough.  No fever or chills. -Patient continues to remain symptomatic -Place in tele bed for obs -Bronchodilators and prn Mucinex  -Singulair -Solu-Medrol  80 mg IV daily after given 125 mg of Solu-Medrol , switched to 40 mg IV twice daily dosing on 6/19 and will now increase to 60 mg IV twice daily due to persistent symptoms -Pulmicort twice daily -Start Tussionex twice daily for severe cough -Procalcitonin negative -Appreciate pulmonology evaluation   HTN (hypertension) -IV hydralazine  as needed - Propranolol   Diabetes mellitus without complication (HCC): Recent A1c 5.9, well-controlled.  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  - Encourage losing weight - Exercise and healthy diet   Anxiety and depression - Continue home medications with Prozac adjusted to a.m. dosing  Positive urine pregnancy test -Likely in the setting of recent miscarriage in April at [redacted] weeks gestation -Serum hCG is 45 -Will confirm with gynecology, however this appears to be related to recent miscarriage -Recommend repeating level outpatient to confirm further decline     DVT prophylaxis:Lovenox  Code Status: Full Family Communication: None at bedside Disposition Plan:  Status is: Inpatient Remains inpatient appropriate because: Ongoing need for IV medications   Consultants:  Pulmonology  Procedures:  None  Antimicrobials:  None   Subjective: Patient seen and evaluated today with ongoing symptoms of wheezing and cough and chest tightness with very minimal improvement overnight.  She does not feel at baseline.  Objective: Vitals:   12/27/23 1959 12/27/23 2049 12/28/23 0100 12/28/23 0759  BP: 117/66   137/81  Pulse: 85   75  Resp: 20   19  Temp: 97.7 F (36.5 C)   97.9 F (36.6 C)  TempSrc:      SpO2: 96% 98% 99% 100%  Weight:      Height:        Intake/Output Summary (Last 24 hours) at 12/28/2023 1333 Last data filed at 12/28/2023 0900 Gross per 24 hour  Intake 320 ml  Output --  Net 320 ml   Filed Weights   12/26/23 1506  Weight: 99.8 kg    Examination:  General exam: Appears calm and comfortable, obese Respiratory system: Wheezing noted bilaterally. Respiratory effort normal. Cardiovascular system: S1 & S2 heard, RRR.  Gastrointestinal system: Abdomen is soft Central nervous system:  Alert and awake Extremities: No edema Skin: No significant lesions noted Psychiatry: Flat affect.    Data Reviewed: I have personally reviewed following labs and imaging studies  CBC: Recent Labs  Lab 12/26/23 1836 12/27/23 0428 12/28/23 0510  WBC 16.8* 11.3* 15.7*  HGB 13.2 13.0 12.3  HCT 40.4 39.8  38.2  MCV 92.7 91.7 92.3  PLT 309 308 326   Basic Metabolic Panel: Recent Labs  Lab 12/26/23 1836 12/28/23 0510  NA 135 136  K 3.6 4.1  CL 104 104  CO2 22 23  GLUCOSE 94 151*  BUN 9 16  CREATININE 0.48 0.49  CALCIUM 9.1 9.4  MG  --  2.6*   GFR: Estimated Creatinine Clearance: 114.8 mL/min (by C-G formula based on SCr of 0.49 mg/dL). Liver Function Tests: Recent Labs  Lab 12/26/23 1836  AST 12*  ALT 20  ALKPHOS 77  BILITOT 0.8  PROT 6.9  ALBUMIN 3.6   No results for input(s): LIPASE, AMYLASE in the last 168 hours. No results for input(s): AMMONIA in the last 168 hours. Coagulation Profile: No results for input(s): INR, PROTIME in the last 168 hours. Cardiac Enzymes: No results for input(s): CKTOTAL, CKMB, CKMBINDEX, TROPONINI in the last 168 hours. BNP (last 3 results) No results for input(s): PROBNP in the last 8760 hours. HbA1C: No results for input(s): HGBA1C in the last 72 hours. CBG: Recent Labs  Lab 12/27/23 1121 12/27/23 1646 12/27/23 2112 12/28/23 0800 12/28/23 1127  GLUCAP 238* 179* 152* 126* 181*   Lipid Profile: No results for input(s): CHOL, HDL, LDLCALC, TRIG, CHOLHDL, LDLDIRECT in the last 72 hours. Thyroid Function Tests: No results for input(s): TSH, T4TOTAL, FREET4, T3FREE, THYROIDAB in the last 72 hours. Anemia Panel: No results for input(s): VITAMINB12, FOLATE, FERRITIN, TIBC, IRON, RETICCTPCT in the last 72 hours. Sepsis Labs: Recent Labs  Lab 12/28/23 0510  PROCALCITON <0.10    Recent Results (from the past 240 hours)  Resp panel by RT-PCR (RSV, Flu A&B, Covid) Anterior Nasal Swab     Status: None   Collection Time: 12/26/23  4:48 PM   Specimen: Anterior Nasal Swab  Result Value Ref Range Status   SARS Coronavirus 2 by RT PCR NEGATIVE NEGATIVE Final    Comment: (NOTE) SARS-CoV-2 target nucleic acids are NOT DETECTED.  The SARS-CoV-2 RNA is generally detectable in upper  respiratory specimens during the acute phase of infection. The lowest concentration of SARS-CoV-2 viral copies this assay can detect is 138 copies/mL. A negative result does not preclude SARS-Cov-2 infection and should not be used as the sole basis for treatment or other patient management decisions. A negative result may occur with  improper specimen collection/handling, submission of specimen other than nasopharyngeal swab, presence of viral mutation(s) within the areas targeted by this assay, and inadequate number of viral copies(<138 copies/mL). A negative result must be combined with clinical observations, patient history, and epidemiological information. The expected result is Negative.  Fact Sheet for Patients:  BloggerCourse.com  Fact Sheet for Healthcare Providers:  SeriousBroker.it  This test is no t yet approved or cleared by the [place]  FDA and  has been authorized for detection and/or diagnosis of SARS-CoV-2 by FDA under an Emergency Use Authorization (EUA). This EUA will remain  in effect (meaning this test can be used) for the duration of the COVID-19 declaration under Section 564(b)(1) of the Act, 21 U.S.C.section 360bbb-3(b)(1), unless the authorization is terminated  or revoked sooner.       Influenza A by  PCR NEGATIVE NEGATIVE Final   Influenza B by PCR NEGATIVE NEGATIVE Final    Comment: (NOTE) The Xpert Xpress SARS-CoV-2/FLU/RSV plus assay is intended as an aid in the diagnosis of influenza from Nasopharyngeal swab specimens and should not be used as a sole basis for treatment. Nasal washings and aspirates are unacceptable for Xpert Xpress SARS-CoV-2/FLU/RSV testing.  Fact Sheet for Patients: BloggerCourse.com  Fact Sheet for Healthcare Providers: SeriousBroker.it  This test is not yet approved or cleared by the [place]  FDA and has been  authorized for detection and/or diagnosis of SARS-CoV-2 by FDA under an Emergency Use Authorization (EUA). This EUA will remain in effect (meaning this test can be used) for the duration of the COVID-19 declaration under Section 564(b)(1) of the Act, 21 U.S.C. section 360bbb-3(b)(1), unless the authorization is terminated or revoked.     Resp Syncytial Virus by PCR NEGATIVE NEGATIVE Final    Comment: (NOTE) Fact Sheet for Patients: BloggerCourse.com  Fact Sheet for Healthcare Providers: SeriousBroker.it  This test is not yet approved or cleared by the [place]  FDA and has been authorized for detection and/or diagnosis of SARS-CoV-2 by FDA under an Emergency Use Authorization (EUA). This EUA will remain in effect (meaning this test can be used) for the duration of the COVID-19 declaration under Section 564(b)(1) of the Act, 21 U.S.C. section 360bbb-3(b)(1), unless the authorization is terminated or revoked.  Performed at [facility], [address] [place], [place] [address]          Radiology Studies: DG Chest 2 View Result Date: 12/26/2023 CLINICAL DATA:  wheezing and dyspnea EXAM: CHEST - 2 VIEW COMPARISON:  None available. FINDINGS: No focal airspace consolidation, pleural effusion, or pneumothorax. No cardiomegaly. No acute fracture or destructive lesion. IMPRESSION: No acute cardiopulmonary abnormality. Electronically Signed   By: Rance Burrows M.D.   On: 12/26/2023 17:56        Scheduled Meds:  budesonide (PULMICORT) nebulizer solution  0.25 mg Nebulization BID   chlorpheniramine-HYDROcodone   5 mL Oral Q12H   enoxaparin  (LOVENOX ) injection  40 mg Subcutaneous Q24H   FLUoxetine  20 mg Oral Daily   insulin aspart  0-5 Units Subcutaneous QHS   insulin aspart  0-9 Units Subcutaneous TID WC   ipratropium-albuterol   3 mL Nebulization Q6H   methylPREDNISolone  (SOLU-MEDROL ) injection  60 mg Intravenous  Q12H   montelukast  10 mg Oral Daily   pantoprazole   40 mg Oral Daily   prenatal multivitamin  1 tablet Oral Q1200   propranolol  10 mg Oral BID   traZODone   100 mg Oral QHS     LOS: 1 day    Time spent: 55 minutes    Zeev Deakins Loran Rock, DO [facility]  If 7PM-7AM, please contact night-coverage [email or web address] 12/28/2023, 1:33 PM

## 2023-12-28 NOTE — Consult Note (Signed)
 NAME:  Stacey Pope, MRN:  [id number], DOB:  Mar 29, 1986, LOS: 1 ADMISSION DATE:  12/26/2023, CONSULTATION DATE: 12/28/2023 REFERRING MD: Doreene Gammon, DO, CHIEF COMPLAINT: Refractory asthma exacerbation  History of Present Illness:  The patient is a 38 year old former smoker, with a history of asthma since childhood and other issues as noted below who presented to [facility] on 26 December 2023 with an asthma exacerbation.  The patient was admitted for management of her asthma exacerbation and has persisted on having symptoms particularly dyspnea on exertion.  Wheezing has improved to some degree.  She has not had any fevers, chills or sweats.  Patient tells me that she has been diagnosed with asthma since childhood and that usually summers are the worst for her respiratory status.  For the last month she has undergone 3 courses of prednisone  tapers and 2 courses of antibiotics without improvement on her symptoms.  She has had chest tightness but no chest pain.  Cough has been dry and nonproductive.  Finished a course of Z-Pak a week prior to admission.  Does not endorse abdominal symptoms.  Does have a history of gastroesophageal reflux.  She has felt somewhat better since admission however does note significant shortness of breath upon ambulating.  She did have a positive pregnancy test on admission however hCG was low, patient does endorse having had a spontaneous miscarriage at 3 weeks in April.  The patient currently works at a [employer] in [place].  She does not have a military history.  No significant occupational exposure.  Prior medications for asthma including Dulera and montelukast.  Also as needed albuterol .  She has never been approached about Biologics for control of asthma.  She has been followed at [facility] for her asthma issues.  She does not endorse any illicit drug use.  She has been on Mounjaro for weight loss and has lost significant amount of weight.  Patient has a history of hypertension and is  on propranolol (nonselective beta-blocker).  Pertinent  Medical History  Severe persistent asthma dependent on steroids Diabetes mellitus type 2 Obesity class II with BMI of 35.5 Hypertension Chronic low back pain  Significant Hospital Events: Including procedures, antibiotic start and stop dates in addition to other pertinent events   06/18: Admitted due to asthma exacerbation 06/20: PCCM consulted to assist with asthma management  Interim History / Subjective:  Significant shortness of breath on exertion.  No chest pain, chest tightness noted.  No lower extremity edema or calf tenderness.  Objective    Blood pressure 124/85, pulse 76, temperature 97.9 F (36.6 C), resp. rate 19, height 5' 6 (1.676 m), weight 99.8 kg, SpO2 98%.        Intake/Output Summary (Last 24 hours) at 12/28/2023 1618 Last data filed at 12/28/2023 0900 Gross per 24 hour  Intake 320 ml  Output --  Net 320 ml   Filed Weights   12/26/23 1506  Weight: 99.8 kg    Examination: GENERAL: Obese woman, no acute distress, sitting up in bed, no tachypnea.  No conversational dyspnea. HEAD: Normocephalic, atraumatic.  EYES: Pupils equal, round, reactive to light.  No scleral icterus.  MOUTH: Pharynx clear, no thrush. NECK: Supple. No thyromegaly. Trachea midline. No JVD.  No adenopathy. PULMONARY: Good air entry bilaterally.  Coarse, otherwise, no adventitious sounds. CARDIOVASCULAR: S1 and S2. Regular rate and rhythm.  No rubs, murmurs or gallops heard. ABDOMEN: Obese, otherwise benign. MUSCULOSKELETAL: No joint deformity, no clubbing, no edema.  NEUROLOGIC: No overt focal deficit, gait  not tested, speech is fluent. SKIN: Intact,warm,dry. PSYCH: Mood and behavior normal.  Calm, cooperative.   Resolved problem list  N/A  Assessment and Plan   #Severe persistent asthma steroid-dependent with exacerbation Continue bronchodilators/ICS switch to Brovana , adjust Pulmicort Change DuoNeb to as  needed Continue steroids for now Continue montelukast She should not be on a nonselective beta-blocker given severe persistent asthma, consider change She will likely need Biologics to control asthma, this can be discussed once she is out of exacerbation  #Dyspnea on exertion Check oximetry during ambulation Check echocardiogram to evaluate for potential cardiac causes of dyspnea Check D-dimer, consider CT angio if positive  #Class II obesity Notes significant weight loss on Mounjaro Obesity adds to persistent of inflammation and asthma Continued weight loss will be of benefit  #Positive pregnancy test Likely related to recent miscarriage Continue to monitor beta hCG  Labs   CBC: Recent Labs  Lab 12/26/23 1836 12/27/23 0428 12/28/23 0510  WBC 16.8* 11.3* 15.7*  HGB 13.2 13.0 12.3  HCT 40.4 39.8 38.2  MCV 92.7 91.7 92.3  PLT 309 308 326    Basic Metabolic Panel: Recent Labs  Lab 12/26/23 1836 12/28/23 0510  NA 135 136  K 3.6 4.1  CL 104 104  CO2 22 23  GLUCOSE 94 151*  BUN 9 16  CREATININE 0.48 0.49  CALCIUM 9.1 9.4  MG  --  2.6*   GFR: Estimated Creatinine Clearance: 114.8 mL/min (by C-G formula based on SCr of 0.49 mg/dL). Recent Labs  Lab 12/26/23 1836 12/27/23 0428 12/28/23 0510  PROCALCITON  --   --  <0.10  WBC 16.8* 11.3* 15.7*    Liver Function Tests: Recent Labs  Lab 12/26/23 1836  AST 12*  ALT 20  ALKPHOS 77  BILITOT 0.8  PROT 6.9  ALBUMIN 3.6   No results for input(s): LIPASE, AMYLASE in the last 168 hours. No results for input(s): AMMONIA in the last 168 hours.  ABG No results found for: PHART, PCO2ART, PO2ART, HCO3, TCO2, ACIDBASEDEF, O2SAT   Coagulation Profile: No results for input(s): INR, PROTIME in the last 168 hours.  Cardiac Enzymes: No results for input(s): CKTOTAL, CKMB, CKMBINDEX, TROPONINI in the last 168 hours.  HbA1C: Hgb A1c MFr Bld  Date/Time Value Ref Range Status   11/22/2021 07:00 AM 5.9 (H) 4.8 - 5.6 % Final    Comment:    (NOTE) Pre diabetes:          5.7%-6.4%  Diabetes:              >6.4%  Glycemic control for   <7.0% adults with diabetes     CBG: Recent Labs  Lab 12/27/23 1646 12/27/23 2112 12/28/23 0800 12/28/23 1127 12/28/23 1428  GLUCAP 179* 152* 126* 181* 158*    Review of Systems:   A 10 point review of systems was performed and it is as noted above otherwise negative.  Past Medical History:  She,  has a past medical history of Arthritis, Asthma, Diabetes mellitus without complication (HCC), Herniated lumbar intervertebral disc, and Hypertension.   Surgical History:   Past Surgical History:  Procedure Laterality Date   BACK SURGERY Right      Social History:   reports that she has quit smoking. Her smoking use included cigars. She has never used smokeless tobacco. She reports that she does not drink alcohol and does not use drugs.   Family History:  Her family history includes Diabetes in her mother and sister; Heart disease in  her father; Hypertension in her mother and sister; Lung cancer in her mother.   Allergies Allergies  Allergen Reactions   Amoxicillin Rash   Augmentin [Amoxicillin-Pot Clavulanate] Anaphylaxis   Azithromycin  Rash   Clavulanic Acid      Home Medications  Prior to Admission medications   Medication Sig Start Date End Date Taking? Authorizing Provider  azithromycin  (ZITHROMAX ) 250 MG tablet Take 250-500 mg by mouth daily. 12/19/23  Yes [provider]  clonazePAM (KLONOPIN) 1 MG tablet Take 1 mg by mouth 2 (two) times daily as needed for anxiety. 11/28/23 12/28/23 Yes [provider]  FLUoxetine (PROZAC) 10 MG capsule Take 20 mg by mouth daily. 12/23/23  Yes [provider]  hydrOXYzine (ATARAX) 25 MG tablet Take 25 mg by mouth 3 (three) times daily as needed. 12/20/23  Yes [provider]  montelukast (SINGULAIR) 10 MG tablet Take 10 mg by mouth daily.  06/11/23  Yes [provider]  MOUNJARO 7.5 MG/0.5ML Pen Inject 7.5 mg into the skin once a week. 11/13/23  Yes [provider]  ondansetron  (ZOFRAN ) 4 MG tablet Take 8 mg by mouth every 8 (eight) hours as needed for nausea. 05/29/23 05/28/24 Yes [provider]  Oxycodone  HCl 10 MG TABS Take 10 mg by mouth every 6 (six) hours as needed (pain). 12/14/23 01/13/24 Yes [provider]  pantoprazole  (PROTONIX ) 40 MG tablet Take 40 mg by mouth daily. 06/21/23 06/20/24 Yes [provider]  promethazine (PHENERGAN) 25 MG tablet Take 25 mg by mouth every 6 (six) hours as needed for nausea. 04/05/23 04/04/24 Yes [provider]  propranolol (INDERAL) 10 MG tablet Take 10 mg by mouth 2 (two) times daily. 11/28/23 11/27/24 Yes [provider]  tiotropium (SPIRIVA) 18 MCG inhalation capsule Place 1 capsule into inhaler and inhale daily.   Yes [provider]  traZODone  (DESYREL ) 50 MG tablet Take 100 mg by mouth at bedtime. 06/29/23 06/28/24 Yes [provider]  amLODipine (NORVASC) 5 MG tablet Take 5 mg by mouth daily. Patient not taking: Reported on 11/22/2021 08/15/21   [provider]  ergocalciferol (VITAMIN D2) 1.25 MG (50000 UT) capsule Take by mouth. Patient not taking: Reported on 12/26/2023 07/27/21   [provider]  lisinopril (ZESTRIL) 20 MG tablet Take 20 mg by mouth daily. Patient not taking: Reported on 12/26/2023 03/21/23 03/20/24  [provider]    Scheduled Meds:  budesonide (PULMICORT) nebulizer solution  0.25 mg Nebulization BID   chlorpheniramine-HYDROcodone   5 mL Oral Q12H   enoxaparin  (LOVENOX ) injection  40 mg Subcutaneous Q24H   FLUoxetine  20 mg Oral Daily   insulin aspart  0-5 Units Subcutaneous QHS   insulin aspart  0-9 Units Subcutaneous TID WC   ipratropium-albuterol   3 mL Nebulization Q6H   methylPREDNISolone  (SOLU-MEDROL ) injection  60 mg Intravenous Q12H   montelukast  10 mg Oral  Daily   pantoprazole   40 mg Oral Daily   prenatal multivitamin  1 tablet Oral Q1200   propranolol  10 mg Oral BID   traZODone   100 mg Oral QHS   Continuous Infusions: PRN Meds:.acetaminophen , albuterol , clonazePAM, dextromethorphan -guaiFENesin , hydrALAZINE , hydrOXYzine, methocarbamol , ondansetron  (ZOFRAN ) IV, oxyCODONE    Level 4 consult    I spent 80 minutes of dedicated to the care of this patient on the date of this encounter to include pre-visit review of records, face-to-face time with the patient discussing conditions above, post visit ordering of testing, clinical documentation with the electronic health record, making appropriate referrals  as documented, and communicating necessary findings to members of the patients care team.   C. Chloe Counter, MD Advanced Bronchoscopy PCCM [facility]    *This note was generated using voice recognition software/Dragon and/or AI transcription program.  Despite best efforts to proofread, errors can occur which can change the meaning. Any transcriptional errors that result from this process are unintentional and may not be fully corrected at the time of dictation.

## 2023-12-29 DIAGNOSIS — J4541 Moderate persistent asthma with (acute) exacerbation: Secondary | ICD-10-CM | POA: Diagnosis not present

## 2023-12-29 LAB — CBC
HCT: 40 % (ref 36.0–46.0)
Hemoglobin: 13.1 g/dL (ref 12.0–15.0)
MCH: 30 pg (ref 26.0–34.0)
MCHC: 32.8 g/dL (ref 30.0–36.0)
MCV: 91.7 fL (ref 80.0–100.0)
Platelets: 317 10*3/uL (ref 150–400)
RBC: 4.36 MIL/uL (ref 3.87–5.11)
RDW: 14 % (ref 11.5–15.5)
WBC: 12 10*3/uL — ABNORMAL HIGH (ref 4.0–10.5)
nRBC: 0 % (ref 0.0–0.2)

## 2023-12-29 LAB — BASIC METABOLIC PANEL WITH GFR
Anion gap: 11 (ref 5–15)
BUN: 22 mg/dL — ABNORMAL HIGH (ref 6–20)
CO2: 21 mmol/L — ABNORMAL LOW (ref 22–32)
Calcium: 9.3 mg/dL (ref 8.9–10.3)
Chloride: 101 mmol/L (ref 98–111)
Creatinine, Ser: 0.65 mg/dL (ref 0.44–1.00)
GFR, Estimated: >60 mL/min (ref >60)
Glucose, Bld: 183 mg/dL — ABNORMAL HIGH (ref 70–99)
Potassium: 3.8 mmol/L (ref 3.5–5.1)
Sodium: 133 mmol/L — ABNORMAL LOW (ref 135–145)

## 2023-12-29 LAB — GLUCOSE, CAPILLARY
Glucose-Capillary: 100 mg/dL — ABNORMAL HIGH (ref 70–99)
Glucose-Capillary: 192 mg/dL — ABNORMAL HIGH (ref 70–99)
Glucose-Capillary: 164 mg/dL — ABNORMAL HIGH (ref 70–99)
Glucose-Capillary: 144 mg/dL — ABNORMAL HIGH (ref 70–99)

## 2023-12-29 LAB — VITAMIN D 25 HYDROXY (VIT D DEFICIENCY, FRACTURES): Vit D, 25-Hydroxy: 29 ng/mL — ABNORMAL LOW (ref 30–100)

## 2023-12-29 LAB — MAGNESIUM: Magnesium: 2.4 mg/dL (ref 1.7–2.4)

## 2023-12-29 MED ORDER — LISINOPRIL 10 MG PO TABS
5.0000 mg | ORAL_TABLET | Freq: Every day | ORAL | Status: DC
  Administered 2023-12-30 – 2023-12-31 (×2): 5 mg via ORAL
  Filled 2023-12-29 (×2): qty 1

## 2023-12-29 NOTE — Progress Notes (Signed)
 NAME:  Stacey Pope, MRN:  [id number], DOB:  09/04/1985, LOS: 2 ADMISSION DATE:  12/26/2023, CONSULTATION DATE: 12/28/2023 REFERRING MD: Adron Fairly, DO, CHIEF COMPLAINT: Refractory asthma exacerbation  History of Present Illness:  The patient is a 38 year old former smoker, with a history of asthma since childhood and other issues as noted below who presented to [facility] on 26 December 2023 with an asthma exacerbation.  The patient was admitted for management of her asthma exacerbation and has persisted on having symptoms particularly dyspnea on exertion.  Wheezing has improved to some degree.  She has not had any fevers, chills or sweats.  Patient tells me that she has been diagnosed with asthma since childhood and that usually summers are the worst for her respiratory status.  For the last month she has undergone 3 courses of prednisone  tapers and 2 courses of antibiotics without improvement on her symptoms.  She has had chest tightness but no chest pain.  Cough has been dry and nonproductive.  Finished a course of Z-Pak a week prior to admission.  Does not endorse abdominal symptoms.  Does have a history of gastroesophageal reflux.  She has felt somewhat better since admission however does note significant shortness of breath upon ambulating.  She did have a positive pregnancy test on admission however hCG was low, patient does endorse having had a spontaneous miscarriage at 3 weeks in April.  The patient currently works at a [employer] in [place].  She does not have a military history.  No significant occupational exposure.  Prior medications for asthma including Dulera and montelukast .  Also as needed albuterol .  She has never been approached about Biologics for control of asthma.  She has been followed at [facility] for her asthma issues.  She does not endorse any illicit drug use.  She has been on Mounjaro for weight loss and has lost significant amount of weight.  Patient has a history of hypertension and is  on propranolol  (nonselective beta-blocker).  Pertinent  Medical History  Severe persistent asthma dependent on steroids Diabetes mellitus type 2 Obesity class II with BMI of 35.5 Hypertension Chronic low back pain  Significant Hospital Events: Including procedures, antibiotic start and stop dates in addition to other pertinent events   06/18: Admitted due to asthma exacerbation 06/20: PCCM consulted to assist with asthma management 06/21: Ambulatory oximetry did not show desaturations, still with significant shortness of breath  Interim History / Subjective:  Still with significant shortness of breath on exertion.  No chest pain, chest tightness noted.  No lower extremity edema or calf tenderness.  Able to ambulate without O2 sat desaturation.  Feels Brovana /Pulmicort  helping.  Objective    Blood pressure 109/80, pulse 71, temperature 98.1 F (36.7 C), resp. rate 18, height 5' 6 (1.676 m), weight 99.8 kg, SpO2 100%.        Intake/Output Summary (Last 24 hours) at 12/29/2023 1136 Last data filed at 12/29/2023 0900 Gross per 24 hour  Intake 360 ml  Output --  Net 360 ml   Filed Weights   12/26/23 1506  Weight: 99.8 kg    Examination: GENERAL: Obese woman, no acute distress, sitting up in bed, no tachypnea.  No conversational dyspnea. HEAD: Normocephalic, atraumatic.  EYES: Pupils equal, round, reactive to light.  No scleral icterus.  MOUTH: Pharynx clear, no thrush. NECK: Supple. No thyromegaly. Trachea midline. No JVD.  No adenopathy. PULMONARY: Good air entry bilaterally.  Few end expiratory wheezes at bases bilaterally.  Few rhonchi. CARDIOVASCULAR: S1  and S2. Regular rate and rhythm.  No rubs, murmurs or gallops heard. ABDOMEN: Obese, otherwise benign. MUSCULOSKELETAL: No joint deformity, no clubbing, no edema.  NEUROLOGIC: No overt focal deficit, gait not tested, speech is fluent. SKIN: Intact,warm,dry. PSYCH: Mood and behavior normal.  Calm,  cooperative.   Resolved problem list  N/A  Assessment and Plan   #Severe persistent asthma steroid-dependent with exacerbation Continue bronchodilators/ICS switched to Brovana , Pulmicort  (increased dose) Change DuoNeb to as needed Continue steroids for now, transition to p.o. steroids in a.m.  Continue montelukast  Propranolol  discontinued yesterday, appropriate given patient's asthma She will likely need Biologics to control asthma, this can be discussed on office follow-up once she is out of exacerbation  #Dyspnea on exertion Oximetry during ambulation did not show desaturations maintained at 98% Echocardiogram pending to evaluate for potential cardiac causes of dyspnea D-dimer normal, no need for CT angio  #Class II obesity Notes significant weight loss on Mounjaro Obesity adds to persistent of inflammation and asthma Continued weight loss will be of benefit  #Positive pregnancy test Likely related to recent miscarriage Beta-hCG normalized  #Vitamin D deficiency Noted on outpatient labs Will recheck level Vitamin D deficiency will aggravate respiratory status  Labs   CBC: Recent Labs  Lab 12/26/23 1836 12/27/23 0428 12/28/23 0510 12/29/23 0506  WBC 16.8* 11.3* 15.7* 12.0*  HGB 13.2 13.0 12.3 13.1  HCT 40.4 39.8 38.2 40.0  MCV 92.7 91.7 92.3 91.7  PLT 309 308 326 317    Basic Metabolic Panel: Recent Labs  Lab 12/26/23 1836 12/28/23 0510 12/29/23 0506  NA 135 136 133*  K 3.6 4.1 3.8  CL 104 104 101  CO2 22 23 21*  GLUCOSE 94 151* 183*  BUN 9 16 22*  CREATININE 0.48 0.49 0.65  CALCIUM 9.1 9.4 9.3  MG  --  2.6* 2.4   GFR: Estimated Creatinine Clearance: 114.8 mL/min (by C-G formula based on SCr of 0.65 mg/dL). Recent Labs  Lab 12/26/23 1836 12/27/23 0428 12/28/23 0510 12/29/23 0506  PROCALCITON  --   --  <0.10  --   WBC 16.8* 11.3* 15.7* 12.0*    Liver Function Tests: Recent Labs  Lab 12/26/23 1836  AST 12*  ALT 20  ALKPHOS 77   BILITOT 0.8  PROT 6.9  ALBUMIN 3.6   No results for input(s): LIPASE, AMYLASE in the last 168 hours. No results for input(s): AMMONIA in the last 168 hours.  ABG No results found for: PHART, PCO2ART, PO2ART, HCO3, TCO2, ACIDBASEDEF, O2SAT   Coagulation Profile: No results for input(s): INR, PROTIME in the last 168 hours.  Cardiac Enzymes: No results for input(s): CKTOTAL, CKMB, CKMBINDEX, TROPONINI in the last 168 hours.  HbA1C: Hgb A1c MFr Bld  Date/Time Value Ref Range Status  11/22/2021 07:00 AM 5.9 (H) 4.8 - 5.6 % Final    Comment:    (NOTE) Pre diabetes:          5.7%-6.4%  Diabetes:              >6.4%  Glycemic control for   <7.0% adults with diabetes     CBG: Recent Labs  Lab 12/28/23 1127 12/28/23 1428 12/28/23 1735 12/28/23 2113 12/29/23 0809  GLUCAP 181* 158* 191* 143* 100*    Review of Systems:   A 10 point review of systems was performed and it is as noted above otherwise negative.  Past Medical History:  She,  has a past medical history of Arthritis, Asthma, Diabetes mellitus without complication (HCC),  Herniated lumbar intervertebral disc, and Hypertension.   Surgical History:   Past Surgical History:  Procedure Laterality Date   BACK SURGERY Right      Social History:   reports that she has quit smoking. Her smoking use included cigars. She has never used smokeless tobacco. She reports that she does not drink alcohol and does not use drugs.   Family History:  Her family history includes Diabetes in her mother and sister; Heart disease in her father; Hypertension in her mother and sister; Lung cancer in her mother.   Allergies Allergies  Allergen Reactions   Amoxicillin Rash   Augmentin [Amoxicillin-Pot Clavulanate] Anaphylaxis   Azithromycin  Rash   Clavulanic Acid      Home Medications  Prior to Admission medications   Medication Sig Start Date End Date Taking? Authorizing Provider  azithromycin   (ZITHROMAX ) 250 MG tablet Take 250-500 mg by mouth daily. 12/19/23  Yes [provider]  clonazePAM  (KLONOPIN ) 1 MG tablet Take 1 mg by mouth 2 (two) times daily as needed for anxiety. 11/28/23 12/28/23 Yes [provider]  FLUoxetine  (PROZAC ) 10 MG capsule Take 20 mg by mouth daily. 12/23/23  Yes [provider]  hydrOXYzine  (ATARAX ) 25 MG tablet Take 25 mg by mouth 3 (three) times daily as needed. 12/20/23  Yes [provider]  montelukast  (SINGULAIR ) 10 MG tablet Take 10 mg by mouth daily. 06/11/23  Yes [provider]  MOUNJARO 7.5 MG/0.5ML Pen Inject 7.5 mg into the skin once a week. 11/13/23  Yes [provider]  ondansetron  (ZOFRAN ) 4 MG tablet Take 8 mg by mouth every 8 (eight) hours as needed for nausea. 05/29/23 05/28/24 Yes [provider]  Oxycodone  HCl 10 MG TABS Take 10 mg by mouth every 6 (six) hours as needed (pain). 12/14/23 01/13/24 Yes [provider]  pantoprazole  (PROTONIX ) 40 MG tablet Take 40 mg by mouth daily. 06/21/23 06/20/24 Yes [provider]  promethazine (PHENERGAN) 25 MG tablet Take 25 mg by mouth every 6 (six) hours as needed for nausea. 04/05/23 04/04/24 Yes [provider]  propranolol  (INDERAL ) 10 MG tablet Take 10 mg by mouth 2 (two) times daily. 11/28/23 11/27/24 Yes [provider]  tiotropium (SPIRIVA) 18 MCG inhalation capsule Place 1 capsule into inhaler and inhale daily.   Yes [provider]  traZODone  (DESYREL ) 50 MG tablet Take 100 mg by mouth at bedtime. 06/29/23 06/28/24 Yes [provider]  amLODipine (NORVASC) 5 MG tablet Take 5 mg by mouth daily. Patient not taking: Reported on 11/22/2021 08/15/21   [provider]  ergocalciferol (VITAMIN D2) 1.25 MG (50000 UT) capsule Take by mouth. Patient not taking: Reported on 12/26/2023 07/27/21   [provider]  lisinopril  (ZESTRIL ) 20 MG tablet Take 20 mg by mouth daily. Patient not taking:  Reported on 12/26/2023 03/21/23 03/20/24  [provider]    Scheduled Meds:  arformoterol   15 mcg Nebulization BID   budesonide  (PULMICORT ) nebulizer solution  0.5 mg Nebulization BID   chlorpheniramine-HYDROcodone   5 mL Oral Q12H   enoxaparin  (LOVENOX ) injection  40 mg Subcutaneous Q24H   FLUoxetine   20 mg Oral Daily   insulin  aspart  0-5 Units Subcutaneous QHS   insulin  aspart  0-9 Units Subcutaneous TID WC   lisinopril   10 mg Oral Daily   methylPREDNISolone  (SOLU-MEDROL ) injection  60 mg Intravenous Q12H   montelukast   10 mg Oral Daily   pantoprazole   40 mg Oral Daily   prenatal multivitamin  1 tablet  Oral Q1200   traZODone   100 mg Oral QHS   Continuous Infusions: PRN Meds:.acetaminophen , clonazePAM , dextromethorphan -guaiFENesin , hydrALAZINE , hydrOXYzine , ipratropium-albuterol , methocarbamol , ondansetron  (ZOFRAN ) IV, oxyCODONE    Level 3 follow-up    I spent 50 minutes of dedicated to the care of this patient on the date of this encounter to include pre-visit review of records, face-to-face time with the patient discussing conditions above, post visit ordering of testing, clinical documentation with the electronic health record, making appropriate referrals as documented, and communicating necessary findings to members of the patients care team.   C. Leita Sanders, MD Advanced Bronchoscopy PCCM [facility]    *This note was generated using voice recognition software/Dragon and/or AI transcription program.  Despite best efforts to proofread, errors can occur which can change the meaning. Any transcriptional errors that result from this process are unintentional and may not be fully corrected at the time of dictation.

## 2023-12-29 NOTE — Progress Notes (Addendum)
 SATURATION QUALIFICATIONS: (This note is used to comply with regulatory documentation for home oxygen)  Patient Saturations on Room Air at Rest = 99%  Patient Saturations on Room Air while Ambulating = 98%  The patient tolerated the walk test well on RA. She had some wheezing upon ambulation, which was causing her some anxiety. She was reassured that her oxygen levels were maintaining within normal limits. Providers updated at bedside and via Epic chat of these results.

## 2023-12-29 NOTE — Progress Notes (Addendum)
 PROGRESS NOTE    TEKOA Pope  FMW:[id number] DOB: 06-29-1986 DOA: 12/26/2023 PCP: Stacey, Pope LABOR, PA-C   Brief Narrative:    Stacey Pope is a 38 y.o. female with medical history significant of asthma, HTN, DM,, GERD, depression with anxiety, obesity, former smoker, chronic back pain with sciatica, who presents with cough, SOB and wheezing.  Patient was admitted for acute asthma exacerbation and continues to have persistent symptoms this morning, but is otherwise on room air.  Urine pregnancy test positive with minimal increase in serum hCG likely related to recent miscarriage.  Patient now being seen by pulmonology and 2D echocardiogram currently pending.  She still continues to feel unwell and is not at baseline.  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  Patient has severe dry cough.  No fever or chills. -Patient continues to remain symptomatic and pulmonology following -Bronchodilators and prn Mucinex  -Singulair  -Solu-Medrol  80 mg IV daily after given 125 mg of Solu-Medrol , switched to 40 mg IV twice daily dosing on 6/19 and will now increase to 60 mg IV twice daily due to persistent symptoms -Pulmicort  twice daily -Start Tussionex twice daily for severe cough - D-dimer negative -2D echocardiogram ordered and pending per pulmonology -Propranolol  discontinued 6/20 and patient now on lisinopril    HTN (hypertension) -IV hydralazine  as needed - Lisinopril  with propranolol  discontinued - Decrease lisinopril  dose to 5 mg given some softer blood pressure readings   Diabetes mellitus without complication (HCC): Recent A1c 5.9, well-controlled.   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  - Encourage losing weight - Exercise and healthy diet   Anxiety and depression - Continue home medications with Prozac  adjusted to a.m. dosing  Positive urine pregnancy test -Likely in the setting of recent miscarriage in April at [redacted] weeks gestation -Serum hCG is 9 -Recommend repeating level outpatient in 1-2 weeks to confirm further decline, discussed with gynecology  DVT prophylaxis:Lovenox  Code Status: Full Family Communication: None at bedside Disposition Plan:  Status is: Inpatient Remains inpatient appropriate because: Ongoing need for IV medications   Consultants:  Pulmonology  Procedures:  None  Antimicrobials:  None   Subjective: Patient seen and evaluated today with significant shortness of breath with exertion, but feels that her medication regimen is starting to help.  She is scared to discharge too early.  Pulmonology following with plans for 2D echocardiogram which is pending.  Objective: Vitals:   12/29/23 0355 12/29/23 0637 12/29/23 0739 12/29/23 0806  BP: (!) 96/52 118/69  109/80  Pulse: (!) 59 68  71  Resp: 16 18  18   Temp: 97.9 F (36.6 C)   98.1 F (36.7 C)  TempSrc: Oral     SpO2: 98% 97% 97% 100%  Weight:      Height:        Intake/Output Summary (Last 24 hours) at 12/29/2023 1203 Last data filed at 12/29/2023 1139 Gross per 24 hour  Intake 480 ml  Output --  Net 480 ml   Filed Weights   12/26/23 1506  Weight: 99.8 kg    Examination:  General exam: Appears calm and comfortable, obese Respiratory system: Wheezing noted bilaterally. Respiratory effort normal. Cardiovascular system: S1 & S2 heard, RRR.  Gastrointestinal system: Abdomen is  soft Central nervous system: Alert and awake Extremities: No edema Skin: No significant lesions noted Psychiatry: Flat affect.    Data Reviewed: I have personally reviewed following labs and imaging studies  CBC: Recent Labs  Lab 12/26/23 1836 12/27/23 0428 12/28/23 0510 12/29/23 0506  WBC 16.8* 11.3* 15.7* 12.0*  HGB 13.2 13.0 12.3 13.1  HCT 40.4 39.8 38.2 40.0  MCV 92.7 91.7 92.3 91.7  PLT 309 308 326 317   Basic Metabolic Panel: Recent Labs  Lab 12/26/23 1836 12/28/23 0510 12/29/23 0506  NA 135 136 133*  K 3.6 4.1 3.8  CL 104 104 101  CO2 22 23 21*  GLUCOSE 94 151* 183*  BUN 9 16 22*  CREATININE 0.48 0.49 0.65  CALCIUM 9.1 9.4 9.3  MG  --  2.6* 2.4   GFR: Estimated Creatinine Clearance: 114.8 mL/min (by C-G formula based on SCr of 0.65 mg/dL). Liver Function Tests: Recent Labs  Lab 12/26/23 1836  AST 12*  ALT 20  ALKPHOS 77  BILITOT 0.8  PROT 6.9  ALBUMIN 3.6   No results for input(s): LIPASE, AMYLASE in the last 168 hours. No results for input(s): AMMONIA in the last 168 hours. Coagulation Profile: No results for input(s): INR, PROTIME in the last 168 hours. Cardiac Enzymes: No results for input(s): CKTOTAL, CKMB, CKMBINDEX, TROPONINI in the last 168 hours. BNP (last 3 results) No results for input(s): PROBNP in the last 8760 hours. HbA1C: No results for input(s): HGBA1C in the last 72 hours. CBG: Recent Labs  Lab 12/28/23 1428 12/28/23 1735 12/28/23 2113 12/29/23 0809 12/29/23 1137  GLUCAP 158* 191* 143* 100* 192*   Lipid Profile: No results for input(s): CHOL, HDL, LDLCALC, TRIG, CHOLHDL, LDLDIRECT in the last 72 hours. Thyroid Function Tests: No results for input(s): TSH, T4TOTAL, FREET4, T3FREE, THYROIDAB in the last 72 hours. Anemia Panel: No results for input(s): VITAMINB12, FOLATE, FERRITIN, TIBC, IRON, RETICCTPCT in the last 72 hours. Sepsis Labs: Recent Labs  Lab  12/28/23 0510  PROCALCITON <0.10    Recent Results (from the past 240 hours)  Resp panel by RT-PCR (RSV, Flu A&B, Covid) Anterior Nasal Swab     Status: None   Collection Time: 12/26/23  4:48 PM   Specimen: Anterior Nasal Swab  Result Value Ref Range Status   SARS Coronavirus 2 by RT PCR NEGATIVE NEGATIVE Final    Comment: (NOTE) SARS-CoV-2 target nucleic acids are NOT DETECTED.  The SARS-CoV-2 RNA is generally detectable in upper respiratory specimens during the acute phase of infection. The lowest concentration of SARS-CoV-2 viral copies this assay can detect is 138 copies/mL. A negative result does not preclude SARS-Cov-2 infection and should not be used as the sole basis for treatment or other patient management decisions. A negative result may occur with  improper specimen collection/handling, submission of specimen other than nasopharyngeal swab, presence of viral mutation(s) within the areas targeted by this assay, and inadequate number of viral copies(<138 copies/mL). A negative result must be combined with clinical observations, patient history, and epidemiological information. The expected result is Negative.  Fact Sheet for Patients:  BloggerCourse.com  Fact Sheet for Healthcare Providers:  SeriousBroker.it  This test is no t yet approved or cleared by the  [place]  FDA and  has been authorized for detection and/or diagnosis of SARS-CoV-2 by FDA under an Emergency Use Authorization (EUA). This EUA will remain  in effect (meaning this test can be used) for the duration of the COVID-19 declaration under Section 564(b)(1) of the Act, 21 U.S.C.section 360bbb-3(b)(1), unless the authorization is terminated  or revoked sooner.       Influenza A by PCR NEGATIVE NEGATIVE Final   Influenza B by PCR NEGATIVE NEGATIVE Final    Comment: (NOTE) The Xpert Xpress SARS-CoV-2/FLU/RSV plus assay is intended as an aid in the  diagnosis of influenza from Nasopharyngeal swab specimens and should not be used as a sole basis for treatment. Nasal washings and aspirates are unacceptable for Xpert Xpress SARS-CoV-2/FLU/RSV testing.  Fact Sheet for Patients: BloggerCourse.com  Fact Sheet for Healthcare Providers: SeriousBroker.it  This test is not yet approved or cleared by the [place]  FDA and has been authorized for detection and/or diagnosis of SARS-CoV-2 by FDA under an Emergency Use Authorization (EUA). This EUA will remain in effect (meaning this test can be used) for the duration of the COVID-19 declaration under Section 564(b)(1) of the Act, 21 U.S.C. section 360bbb-3(b)(1), unless the authorization is terminated or revoked.     Resp Syncytial Virus by PCR NEGATIVE NEGATIVE Final    Comment: (NOTE) Fact Sheet for Patients: BloggerCourse.com  Fact Sheet for Healthcare Providers: SeriousBroker.it  This test is not yet approved or cleared by the [place]  FDA and has been authorized for detection and/or diagnosis of SARS-CoV-2 by FDA under an Emergency Use Authorization (EUA). This EUA will remain in effect (meaning this test can be used) for the duration of the COVID-19 declaration under Section 564(b)(1) of the Act, 21 U.S.C. section 360bbb-3(b)(1), unless the authorization is terminated or revoked.  Performed at [facility], [address] [place], [place] [address]          Radiology Studies: No results found.       Scheduled Meds:  arformoterol   15 mcg Nebulization BID   budesonide  (PULMICORT ) nebulizer solution  0.5 mg Nebulization BID   chlorpheniramine-HYDROcodone   5 mL Oral Q12H   enoxaparin  (LOVENOX ) injection  40 mg Subcutaneous Q24H   FLUoxetine   20 mg Oral Daily   insulin  aspart  0-5 Units Subcutaneous QHS   insulin  aspart  0-9 Units Subcutaneous TID  WC   lisinopril   10 mg Oral Daily   methylPREDNISolone  (SOLU-MEDROL ) injection  60 mg Intravenous Q12H   montelukast   10 mg Oral Daily   pantoprazole   40 mg Oral Daily   prenatal multivitamin  1 tablet Oral Q1200   traZODone   100 mg Oral QHS     LOS: 2 days    Time spent: 55 minutes    Kirbi Farrugia JONETTA Fairly, DO [facility]  If 7PM-7AM, please contact night-coverage [email or web address] 12/29/2023, 12:03 PM

## 2023-12-29 NOTE — TOC CM/SW Note (Signed)
 Transition of Care [facility]) - Inpatient Brief Assessment   Patient Details  Name: Stacey Pope MRN: [id number] Date of Birth: [birth year]  Transition of Care [facility]) CM/SW Contact:    Houa Nie E Ziah Leandro, LCSW Phone Number: 12/29/2023, 12:17 PM   Clinical Narrative: No needs identified during chart review. Please consult TOC if needs arise prior to discharge.    Transition of Care Asessment: Insurance and Status: Insurance coverage has been reviewed Patient has primary care physician: Yes     Prior/Current Home Services: No current home services Social Drivers of Health Review: SDOH reviewed no interventions necessary Readmission risk has been reviewed: Yes Transition of care needs: no transition of care needs at this time

## 2023-12-30 DIAGNOSIS — J4551 Severe persistent asthma with (acute) exacerbation: Secondary | ICD-10-CM | POA: Diagnosis not present

## 2023-12-30 LAB — BASIC METABOLIC PANEL WITH GFR
Anion gap: 8 (ref 5–15)
BUN: 23 mg/dL — ABNORMAL HIGH (ref 6–20)
CO2: 23 mmol/L (ref 22–32)
Calcium: 9.5 mg/dL (ref 8.9–10.3)
Chloride: 103 mmol/L (ref 98–111)
Creatinine, Ser: 0.55 mg/dL (ref 0.44–1.00)
GFR, Estimated: >60 mL/min (ref >60)
Glucose, Bld: 123 mg/dL — ABNORMAL HIGH (ref 70–99)
Potassium: 4 mmol/L (ref 3.5–5.1)
Sodium: 134 mmol/L — ABNORMAL LOW (ref 135–145)

## 2023-12-30 LAB — GLUCOSE, CAPILLARY
Glucose-Capillary: 180 mg/dL — ABNORMAL HIGH (ref 70–99)
Glucose-Capillary: 133 mg/dL — ABNORMAL HIGH (ref 70–99)
Glucose-Capillary: 144 mg/dL — ABNORMAL HIGH (ref 70–99)
Glucose-Capillary: 132 mg/dL — ABNORMAL HIGH (ref 70–99)

## 2023-12-30 LAB — CBC
HCT: 41.4 % (ref 36.0–46.0)
Hemoglobin: 13.8 g/dL (ref 12.0–15.0)
MCH: 30.4 pg (ref 26.0–34.0)
MCHC: 33.3 g/dL (ref 30.0–36.0)
MCV: 91.2 fL (ref 80.0–100.0)
Platelets: 341 10*3/uL (ref 150–400)
RBC: 4.54 MIL/uL (ref 3.87–5.11)
RDW: 14 % (ref 11.5–15.5)
WBC: 15.9 10*3/uL — ABNORMAL HIGH (ref 4.0–10.5)
nRBC: 0 % (ref 0.0–0.2)

## 2023-12-30 LAB — MAGNESIUM: Magnesium: 2.4 mg/dL (ref 1.7–2.4)

## 2023-12-30 MED ORDER — METHYLPREDNISOLONE SODIUM SUCC 40 MG IJ SOLR
40.0000 mg | Freq: Two times a day (BID) | INTRAMUSCULAR | Status: AC
  Administered 2023-12-30: 40 mg via INTRAVENOUS
  Filled 2023-12-30: qty 1

## 2023-12-30 MED ORDER — BUSPIRONE HCL 10 MG PO TABS: 5.0000 mg | ORAL_TABLET | Freq: Two times a day (BID) | ORAL | Status: DC

## 2023-12-30 MED ORDER — PREDNISONE 20 MG PO TABS
40.0000 mg | ORAL_TABLET | Freq: Every day | ORAL | Status: DC
  Administered 2023-12-31: 40 mg via ORAL
  Filled 2023-12-30: qty 4

## 2023-12-30 NOTE — Progress Notes (Signed)
 PROGRESS NOTE    Stacey Pope  FMW:[id number] DOB: May 20, 1986 DOA: 12/26/2023 PCP: Tester, Rolland LABOR, PA-C   Brief Narrative:    Stacey Pope is a 38 y.o. female with medical history significant of asthma, HTN, DM,, GERD, depression with anxiety, obesity, former smoker, chronic back pain with sciatica, who presents with cough, SOB and wheezing.  Patient was admitted for acute asthma exacerbation and continues to have persistent symptoms this morning, but is otherwise on room air.  Urine pregnancy test positive with minimal increase in serum hCG likely related to recent miscarriage.  Patient now being seen by pulmonology and 2D echocardiogram currently pending.  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  Patient has severe dry cough.  No fever or chills. -Patient continues to remain symptomatic and pulmonology following -Bronchodilators and prn Mucinex  -Singulair  - Wean Solu-Medrol  to p.o. prednisone  to start the morning -Pulmicort  twice daily -Start Tussionex twice daily for severe cough - D-dimer negative -2D echocardiogram ordered and pending per pulmonology -Propranolol  discontinued 6/20 and patient now on lisinopril    HTN (hypertension) -IV hydralazine  as needed - Lisinopril  with propranolol  discontinued   Diabetes mellitus without complication (HCC): Recent A1c 5.9, well-controlled.  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  - Encourage losing weight - Exercise and healthy diet   Anxiety and depression - Continue home medications with Prozac  adjusted to a.m. dosing -Consider BuSpar addition as an outpatient once through this exacerbation  Positive urine pregnancy test -Likely in the setting of recent miscarriage in April at [redacted] weeks gestation -Serum hCG is 9 -Recommend repeating level outpatient in 1-2 weeks to confirm further decline, discussed with gynecology  DVT prophylaxis:Lovenox  Code Status: Full Family Communication: None at bedside Disposition Plan:  Status is: Inpatient Remains inpatient appropriate because: Ongoing need for IV medications  Suspected patient continues to improve should be ready to go home on 6/23   Consultants:  Pulmonology    Subjective: Patient continues to have a lot of anxiety regarding cardiac disease as she states she has a long family history of cardiac issues in both sisters and her father  Objective: Vitals:   12/29/23 1948 12/29/23 2013 12/30/23 0742 12/30/23 0911  BP: 112/79   125/70  Pulse: 72   72  Resp: 18   18  Temp: 98.2 F (36.8 C)   97.9 F (36.6 C)  TempSrc: Oral   Oral  SpO2: 98% 100% 100% 98%  Weight:      Height:        Intake/Output Summary (Last 24 hours) at 12/30/2023 1058 Last data filed at 12/30/2023 0900 Gross per 24 hour  Intake 360 ml  Output --  Net 360 ml   Filed Weights   12/26/23 1506  Weight: 99.8 kg    Examination:   General: Appearance:    Obese female in no acute distress  Lungs:      respirations unlabored  Heart:    Normal heart rate.   MS:   All extremities are intact.   Neurologic:   Awake, alert       Data Reviewed: I have personally reviewed following labs and imaging studies  CBC: Recent Labs  Lab 12/26/23 1836 12/27/23 0428 12/28/23 0510 12/29/23 0506 12/30/23 0503  WBC 16.8*  11.3* 15.7* 12.0* 15.9*  HGB 13.2 13.0 12.3 13.1 13.8  HCT 40.4 39.8 38.2 40.0 41.4  MCV 92.7 91.7 92.3 91.7 91.2  PLT 309 308 326 317 341   Basic Metabolic Panel: Recent Labs  Lab 12/26/23 1836 12/28/23 0510 12/29/23 0506 12/30/23 0503  NA 135 136 133* 134*  K 3.6 4.1 3.8 4.0  CL 104 104 101 103  CO2 22 23 21* 23  GLUCOSE 94 151* 183* 123*  BUN 9 16 22* 23*  CREATININE 0.48 0.49 0.65 0.55  CALCIUM 9.1 9.4 9.3 9.5  MG  --  2.6* 2.4 2.4   GFR: Estimated Creatinine Clearance: 114.8 mL/min (by C-G formula based on SCr of 0.55 mg/dL). Liver Function Tests: Recent Labs  Lab 12/26/23 1836  AST 12*  ALT 20  ALKPHOS 77  BILITOT 0.8  PROT 6.9  ALBUMIN 3.6   No results for input(s): LIPASE, AMYLASE in the last 168 hours. No results for input(s): AMMONIA in the last 168 hours. Coagulation Profile: No results for input(s): INR, PROTIME in the last 168 hours. Cardiac Enzymes: No results for input(s): CKTOTAL, CKMB, CKMBINDEX, TROPONINI in the last 168 hours. BNP (last 3 results) No results for input(s): PROBNP in the last 8760 hours. HbA1C: No results for input(s): HGBA1C in the last 72 hours. CBG: Recent Labs  Lab 12/29/23 0809 12/29/23 1137 12/29/23 1632 12/29/23 2144 12/30/23 0900  GLUCAP 100* 192* 164* 144* 180*   Lipid Profile: No results for input(s): CHOL, HDL, LDLCALC, TRIG, CHOLHDL, LDLDIRECT in the last 72 hours. Thyroid Function Tests: No results for input(s): TSH, T4TOTAL, FREET4, T3FREE, THYROIDAB in the last 72 hours. Anemia Panel: No results for input(s): VITAMINB12, FOLATE, FERRITIN, TIBC, IRON, RETICCTPCT in the last 72 hours. Sepsis Labs: Recent Labs  Lab 12/28/23 0510  PROCALCITON <0.10    Recent Results (from the past 240 hours)  Resp panel by RT-PCR (RSV, Flu A&B, Covid) Anterior Nasal Swab     Status: None   Collection Time: 12/26/23  4:48 PM   Specimen: Anterior Nasal Swab   Result Value Ref Range Status   SARS Coronavirus 2 by RT PCR NEGATIVE NEGATIVE Final    Comment: (NOTE) SARS-CoV-2 target nucleic acids are NOT DETECTED.  The SARS-CoV-2 RNA is generally detectable in upper respiratory specimens during the acute phase of infection. The lowest concentration of SARS-CoV-2 viral copies this assay can detect is 138 copies/mL. A negative result does not preclude SARS-Cov-2 infection and should not be used as the sole basis for treatment or other patient management decisions. A negative result may occur with  improper specimen collection/handling, submission of specimen other than nasopharyngeal swab, presence of viral mutation(s) within the areas targeted by this assay, and inadequate number of viral copies(<138 copies/mL). A negative result must be combined with clinical observations, patient history, and epidemiological information. The expected result is Negative.  Fact Sheet for Patients:  BloggerCourse.com  Fact Sheet for Healthcare Providers:  SeriousBroker.it  This test is no t yet approved or cleared by the [place]  FDA and  has been authorized for detection and/or diagnosis  of SARS-CoV-2 by FDA under an Emergency Use Authorization (EUA). This EUA will remain  in effect (meaning this test can be used) for the duration of the COVID-19 declaration under Section 564(b)(1) of the Act, 21 U.S.C.section 360bbb-3(b)(1), unless the authorization is terminated  or revoked sooner.       Influenza A by PCR NEGATIVE NEGATIVE Final   Influenza B by PCR NEGATIVE NEGATIVE Final    Comment: (NOTE) The Xpert Xpress SARS-CoV-2/FLU/RSV plus assay is intended as an aid in the diagnosis of influenza from Nasopharyngeal swab specimens and should not be used as a sole basis for treatment. Nasal washings and aspirates are unacceptable for Xpert Xpress SARS-CoV-2/FLU/RSV testing.  Fact Sheet for  Patients: BloggerCourse.com  Fact Sheet for Healthcare Providers: SeriousBroker.it  This test is not yet approved or cleared by the [place]  FDA and has been authorized for detection and/or diagnosis of SARS-CoV-2 by FDA under an Emergency Use Authorization (EUA). This EUA will remain in effect (meaning this test can be used) for the duration of the COVID-19 declaration under Section 564(b)(1) of the Act, 21 U.S.C. section 360bbb-3(b)(1), unless the authorization is terminated or revoked.     Resp Syncytial Virus by PCR NEGATIVE NEGATIVE Final    Comment: (NOTE) Fact Sheet for Patients: BloggerCourse.com  Fact Sheet for Healthcare Providers: SeriousBroker.it  This test is not yet approved or cleared by the [place]  FDA and has been authorized for detection and/or diagnosis of SARS-CoV-2 by FDA under an Emergency Use Authorization (EUA). This EUA will remain in effect (meaning this test can be used) for the duration of the COVID-19 declaration under Section 564(b)(1) of the Act, 21 U.S.C. section 360bbb-3(b)(1), unless the authorization is terminated or revoked.  Performed at [facility], [address], [place], [place] [address]          Radiology Studies: No results found.       Scheduled Meds:  arformoterol   15 mcg Nebulization BID   budesonide  (PULMICORT ) nebulizer solution  0.5 mg Nebulization BID   busPIRone  5 mg Oral BID   chlorpheniramine-HYDROcodone   5 mL Oral Q12H   enoxaparin  (LOVENOX ) injection  40 mg Subcutaneous Q24H   FLUoxetine   20 mg Oral Daily   insulin  aspart  0-5 Units Subcutaneous QHS   insulin  aspart  0-9 Units Subcutaneous TID WC   lisinopril   5 mg Oral Daily   methylPREDNISolone  (SOLU-MEDROL ) injection  60 mg Intravenous Q12H   montelukast   10 mg Oral Daily   pantoprazole   40 mg Oral Daily   prenatal multivitamin   1 tablet Oral Q1200   traZODone   100 mg Oral QHS     LOS: 3 days    Time spent: 55 minutes    Harlene RAYMOND Bowl, DO [facility]  If 7PM-7AM, please contact night-coverage [email or web address] 12/30/2023, 10:58 AM

## 2023-12-30 NOTE — Progress Notes (Signed)
 NAME:  Stacey Pope, MRN:  [id number], DOB:  1985/12/08, LOS: 3 ADMISSION DATE:  12/26/2023, CONSULTATION DATE: 12/28/2023 REFERRING MD: Adron Fairly, DO, CHIEF COMPLAINT: Refractory asthma exacerbation  History of Present Illness:  The patient is a 38 year old former smoker, with a history of asthma since childhood and other issues as noted below who presented to [facility] on 26 December 2023 with an asthma exacerbation.  The patient was admitted for management of her asthma exacerbation and has persisted on having symptoms particularly dyspnea on exertion.  Wheezing has improved to some degree.  She has not had any fevers, chills or sweats.  Patient tells me that she has been diagnosed with asthma since childhood and that usually summers are the worst for her respiratory status.  For the last month she has undergone 3 courses of prednisone  tapers and 2 courses of antibiotics without improvement on her symptoms.  She has had chest tightness but no chest pain.  Cough has been dry and nonproductive.  Finished a course of Z-Pak a week prior to admission.  Does not endorse abdominal symptoms.  Does have a history of gastroesophageal reflux.  She has felt somewhat better since admission however does note significant shortness of breath upon ambulating.  She did have a positive pregnancy test on admission however hCG was low, patient does endorse having had a spontaneous miscarriage at 3 weeks in April.  The patient currently works at a [employer] in [place].  She does not have a military history.  No significant occupational exposure.  Prior medications for asthma including Dulera and montelukast .  Also as needed albuterol .  She has never been approached about Biologics for control of asthma.  She has been followed at [facility] for her asthma issues.  She does not endorse any illicit drug use.  She has been on Mounjaro for weight loss and has lost significant amount of weight.  Patient has a history of hypertension and is  on propranolol  (nonselective beta-blocker).  Pertinent  Medical History  Severe persistent asthma dependent on steroids Diabetes mellitus type 2 Obesity class II with BMI of 35.5 Hypertension Chronic low back pain  Significant Hospital Events: Including procedures, antibiotic start and stop dates in addition to other pertinent events   06/18: Admitted due to asthma exacerbation 06/20: PCCM consulted to assist with asthma management 06/21: Ambulatory oximetry did not show desaturations, still with significant shortness of breath 06/22: No respiratory distress.  Interim History / Subjective:  Continues with slow improvement.  No chest pain.  Cough well-controlled.  Objective    Blood pressure 125/70, pulse 72, temperature 97.9 F (36.6 C), temperature source Oral, resp. rate 18, height 5' 6 (1.676 m), weight 99.8 kg, SpO2 98%.        Intake/Output Summary (Last 24 hours) at 12/30/2023 1036 Last data filed at 12/29/2023 1913 Gross per 24 hour  Intake 360 ml  Output --  Net 360 ml   Filed Weights   12/26/23 1506  Weight: 99.8 kg    Examination: GENERAL: Obese woman, no acute distress, sitting up in bed, no tachypnea.  No conversational dyspnea. HEAD: Normocephalic, atraumatic.  EYES: Pupils equal, round, reactive to light.  No scleral icterus.  MOUTH: Pharynx clear, no thrush. NECK: Supple. No thyromegaly. Trachea midline. No JVD.  No adenopathy. PULMONARY: Good air entry bilaterally.  No wheezes noted.  Few rhonchi at left base.  CARDIOVASCULAR: S1 and S2. Regular rate and rhythm.  No rubs, murmurs or gallops heard. ABDOMEN: Obese, otherwise benign.  MUSCULOSKELETAL: No joint deformity, no clubbing, no edema.  NEUROLOGIC: No overt focal deficit, gait not tested, speech is fluent. SKIN: Intact,warm,dry. PSYCH: Mood and behavior normal.  Calm, cooperative.   Resolved problem list  N/A  Assessment and Plan   #Severe persistent asthma steroid-dependent with  exacerbation Continue bronchodilators/ICS switched to Brovana , Pulmicort  (increased dose) Transition to Dulera 200/5, 2 inhalations twice a day, recommend spacer for the inhaler Continue DuoNeb to as needed, provide nebulizer machine for home Continue steroids for now, transition to p.o. steroids today taper over one week Continue montelukast  Propranolol  discontinued 06/20, appropriate given patient's asthma She will likely need Biologics to control asthma, this can be discussed on office follow-up once she is out of exacerbation We will arrange for follow-up at [facility] [place]  #Dyspnea on exertion Oximetry during ambulation did not show desaturations, maintained at 98% Echocardiogram pending to evaluate for potential cardiac causes of dyspnea D-dimer normal, no need for CT angio  #Class II obesity Notes significant weight loss on Mounjaro Obesity adds to persistent of inflammation and asthma Continued weight loss will be of benefit  #Positive pregnancy test Likely related to recent miscarriage Beta-hCG normalized  #Vitamin D deficiency Noted on outpatient labs Vitamin D level low Vitamin D deficiency will aggravate respiratory status Recommend supplementation with vitamin D3  Labs   CBC: Recent Labs  Lab 12/26/23 1836 12/27/23 0428 12/28/23 0510 12/29/23 0506 12/30/23 0503  WBC 16.8* 11.3* 15.7* 12.0* 15.9*  HGB 13.2 13.0 12.3 13.1 13.8  HCT 40.4 39.8 38.2 40.0 41.4  MCV 92.7 91.7 92.3 91.7 91.2  PLT 309 308 326 317 341    Basic Metabolic Panel: Recent Labs  Lab 12/26/23 1836 12/28/23 0510 12/29/23 0506 12/30/23 0503  NA 135 136 133* 134*  K 3.6 4.1 3.8 4.0  CL 104 104 101 103  CO2 22 23 21* 23  GLUCOSE 94 151* 183* 123*  BUN 9 16 22* 23*  CREATININE 0.48 0.49 0.65 0.55  CALCIUM 9.1 9.4 9.3 9.5  MG  --  2.6* 2.4 2.4   GFR: Estimated Creatinine Clearance: 114.8 mL/min (by C-G formula based on SCr of 0.55 mg/dL). Recent Labs  Lab  12/27/23 0428 12/28/23 0510 12/29/23 0506 12/30/23 0503  PROCALCITON  --  <0.10  --   --   WBC 11.3* 15.7* 12.0* 15.9*    Liver Function Tests: Recent Labs  Lab 12/26/23 1836  AST 12*  ALT 20  ALKPHOS 77  BILITOT 0.8  PROT 6.9  ALBUMIN 3.6   No results for input(s): LIPASE, AMYLASE in the last 168 hours. No results for input(s): AMMONIA in the last 168 hours.  ABG No results found for: PHART, PCO2ART, PO2ART, HCO3, TCO2, ACIDBASEDEF, O2SAT   Coagulation Profile: No results for input(s): INR, PROTIME in the last 168 hours.  Cardiac Enzymes: No results for input(s): CKTOTAL, CKMB, CKMBINDEX, TROPONINI in the last 168 hours.  HbA1C: Hgb A1c MFr Bld  Date/Time Value Ref Range Status  11/22/2021 07:00 AM 5.9 (H) 4.8 - 5.6 % Final    Comment:    (NOTE) Pre diabetes:          5.7%-6.4%  Diabetes:              >6.4%  Glycemic control for   <7.0% adults with diabetes     CBG: Recent Labs  Lab 12/29/23 0809 12/29/23 1137 12/29/23 1632 12/29/23 2144 12/30/23 0900  GLUCAP 100* 192* 164* 144* 180*    Review of Systems:  A 10 point review of systems was performed and it is as noted above otherwise negative.  Past Medical History:  She,  has a past medical history of Arthritis, Asthma, Diabetes mellitus without complication (HCC), Herniated lumbar intervertebral disc, and Hypertension.   Surgical History:   Past Surgical History:  Procedure Laterality Date   BACK SURGERY Right      Social History:   reports that she has quit smoking. Her smoking use included cigars. She has never used smokeless tobacco. She reports that she does not drink alcohol and does not use drugs.   Family History:  Her family history includes Diabetes in her mother and sister; Heart disease in her father; Hypertension in her mother and sister; Lung cancer in her mother.   Allergies Allergies  Allergen Reactions   Amoxicillin Rash   Augmentin  [Amoxicillin-Pot Clavulanate] Anaphylaxis   Clavulanic Acid      Home Medications  Prior to Admission medications   Medication Sig Start Date End Date Taking? Authorizing Provider  azithromycin  (ZITHROMAX ) 250 MG tablet Take 250-500 mg by mouth daily. 12/19/23  Yes [provider]  clonazePAM  (KLONOPIN ) 1 MG tablet Take 1 mg by mouth 2 (two) times daily as needed for anxiety. 11/28/23 12/28/23 Yes [provider]  FLUoxetine  (PROZAC ) 10 MG capsule Take 20 mg by mouth daily. 12/23/23  Yes [provider]  hydrOXYzine  (ATARAX ) 25 MG tablet Take 25 mg by mouth 3 (three) times daily as needed. 12/20/23  Yes [provider]  montelukast  (SINGULAIR ) 10 MG tablet Take 10 mg by mouth daily. 06/11/23  Yes [provider]  MOUNJARO 7.5 MG/0.5ML Pen Inject 7.5 mg into the skin once a week. 11/13/23  Yes [provider]  ondansetron  (ZOFRAN ) 4 MG tablet Take 8 mg by mouth every 8 (eight) hours as needed for nausea. 05/29/23 05/28/24 Yes [provider]  Oxycodone  HCl 10 MG TABS Take 10 mg by mouth every 6 (six) hours as needed (pain). 12/14/23 01/13/24 Yes [provider]  pantoprazole  (PROTONIX ) 40 MG tablet Take 40 mg by mouth daily. 06/21/23 06/20/24 Yes [provider]  promethazine (PHENERGAN) 25 MG tablet Take 25 mg by mouth every 6 (six) hours as needed for nausea. 04/05/23 04/04/24 Yes [provider]  propranolol  (INDERAL ) 10 MG tablet Take 10 mg by mouth 2 (two) times daily. 11/28/23 11/27/24 Yes [provider]  tiotropium (SPIRIVA) 18 MCG inhalation capsule Place 1 capsule into inhaler and inhale daily.   Yes [provider]  traZODone  (DESYREL ) 50 MG tablet Take 100 mg by mouth at bedtime. 06/29/23 06/28/24 Yes [provider]  amLODipine (NORVASC) 5 MG tablet Take 5 mg by mouth daily. Patient not taking: Reported on 11/22/2021 08/15/21   [provider]  ergocalciferol (VITAMIN D2) 1.25  MG (50000 UT) capsule Take by mouth. Patient not taking: Reported on 12/26/2023 07/27/21   [provider]  lisinopril  (ZESTRIL ) 20 MG tablet Take 20 mg by mouth daily. Patient not taking: Reported on 12/26/2023 03/21/23 03/20/24  [provider]    Scheduled Meds:  arformoterol   15 mcg Nebulization BID   budesonide  (PULMICORT ) nebulizer solution  0.5 mg Nebulization BID   busPIRone  5 mg Oral BID   chlorpheniramine-HYDROcodone   5 mL Oral Q12H   enoxaparin  (LOVENOX ) injection  40 mg Subcutaneous Q24H   FLUoxetine   20 mg Oral Daily   insulin  aspart  0-5 Units Subcutaneous QHS   insulin  aspart  0-9 Units Subcutaneous TID WC  lisinopril   5 mg Oral Daily   methylPREDNISolone  (SOLU-MEDROL ) injection  60 mg Intravenous Q12H   montelukast   10 mg Oral Daily   pantoprazole   40 mg Oral Daily   prenatal multivitamin  1 tablet Oral Q1200   traZODone   100 mg Oral QHS   Continuous Infusions: PRN Meds:.acetaminophen , clonazePAM , dextromethorphan -guaiFENesin , hydrALAZINE , hydrOXYzine , ipratropium-albuterol , methocarbamol , ondansetron  (ZOFRAN ) IV, oxyCODONE    Level 2 follow-up   Will arrange for follow-up as outpatient in the clinic.  PCCM will sign off please reconsult as needed.  I spent 40 minutes of dedicated to the care of this patient on the date of this encounter to include pre-visit review of records, face-to-face time with the patient discussing conditions above, post visit ordering of testing, clinical documentation with the electronic health record, making appropriate referrals as documented, and communicating necessary findings to members of the patients care team.   C. Leita Sanders, MD Advanced Bronchoscopy PCCM [facility]    *This note was generated using voice recognition software/Dragon and/or AI transcription program.  Despite best efforts to proofread, errors can occur which can change the meaning. Any transcriptional errors that result from  this process are unintentional and may not be fully corrected at the time of dictation.

## 2023-12-31 ENCOUNTER — Inpatient Hospital Stay (HOSPITAL_COMMUNITY)
Admit: 2023-12-31 | Discharge: 2023-12-31 | Disposition: A | Discharge status: Home / Self Care | Attending: Pulmonary Disease | Admitting: Pulmonary Disease

## 2023-12-31 DIAGNOSIS — D72829 Elevated white blood cell count, unspecified: Secondary | ICD-10-CM | POA: Diagnosis not present

## 2023-12-31 DIAGNOSIS — R0609 Other forms of dyspnea: Secondary | ICD-10-CM

## 2023-12-31 DIAGNOSIS — E119 Type 2 diabetes mellitus without complications: Secondary | ICD-10-CM | POA: Diagnosis not present

## 2023-12-31 DIAGNOSIS — I10 Essential (primary) hypertension: Secondary | ICD-10-CM | POA: Diagnosis not present

## 2023-12-31 DIAGNOSIS — J4541 Moderate persistent asthma with (acute) exacerbation: Secondary | ICD-10-CM | POA: Diagnosis not present

## 2023-12-31 LAB — ECHOCARDIOGRAM COMPLETE
AR max vel: 3.53 cm2
AV Area VTI: 3.58 cm2
AV Area mean vel: 3.39 cm2
AV Mean grad: 4 mmHg
AV Peak grad: 6.5 mmHg
Ao pk vel: 1.27 m/s
Area-P 1/2: 2.44 cm2
Height: 66 in
MV VTI: 3.24 cm2
S' Lateral: 2.5 cm
Weight: 3520 [oz_av]

## 2023-12-31 LAB — GLUCOSE, CAPILLARY
Glucose-Capillary: 136 mg/dL — ABNORMAL HIGH (ref 70–99)
Glucose-Capillary: 115 mg/dL — ABNORMAL HIGH (ref 70–99)

## 2023-12-31 MED ORDER — TIOTROPIUM BROMIDE MONOHYDRATE 18 MCG IN CAPS
1.0000 | ORAL_CAPSULE | Freq: Every day | RESPIRATORY_TRACT | 12 refills | Status: AC
Start: 2023-12-31 — End: ?

## 2023-12-31 MED ORDER — VITAMIN D (ERGOCALCIFEROL) 1.25 MG (50000 UNIT) PO CAPS: 50000.0000 [IU] | ORAL_CAPSULE | ORAL | 0 refills | Status: AC

## 2023-12-31 MED ORDER — HYDROCOD POLI-CHLORPHE POLI ER 10-8 MG/5ML PO SUER: 5.0000 mL | Freq: Two times a day (BID) | ORAL | 0 refills | Status: AC | PRN

## 2023-12-31 MED ORDER — LOSARTAN POTASSIUM 25 MG PO TABS: 25.0000 mg | ORAL_TABLET | Freq: Every day | ORAL | 1 refills | Status: AC

## 2023-12-31 MED ORDER — VITAMIN D (ERGOCALCIFEROL) 1.25 MG (50000 UNIT) PO CAPS
50000.0000 [IU] | ORAL_CAPSULE | ORAL | Status: DC
  Administered 2023-12-31: 50000 [IU] via ORAL
  Filled 2023-12-31: qty 1

## 2023-12-31 MED ORDER — PREDNISONE 10 MG PO TABS: 40.0000 mg | ORAL_TABLET | Freq: Every day | ORAL | 0 refills | Status: DC

## 2023-12-31 MED ORDER — MOMETASONE FURO-FORMOTEROL FUM 200-5 MCG/ACT IN AERO: 2.0000 | INHALATION_SPRAY | Freq: Two times a day (BID) | RESPIRATORY_TRACT | 2 refills | Status: AC

## 2023-12-31 MED ORDER — IPRATROPIUM-ALBUTEROL 0.5-2.5 (3) MG/3ML IN SOLN: 3.0000 mL | RESPIRATORY_TRACT | 2 refills | Status: AC | PRN

## 2023-12-31 NOTE — Progress Notes (Signed)
 Patient left unit by wheelchair, alert with no distress noted with [place], NT. Discharged home driving self.

## 2023-12-31 NOTE — Progress Notes (Signed)
 Discharge instructions given to and reviewed with patient. Patient verbalized understanding of all discharge instructions including follow up care, changes to medications and new prescriptions. Waiting for home nebulizer to be delivered.

## 2023-12-31 NOTE — TOC Transition Note (Signed)
 Transition of Care [facility]) - Discharge Note   Patient Details  Name: Stacey Pope MRN: [id number] Date of Birth: 1986/01/19  [facility] CM/SW Contact:  Marinda Cooks, RN Phone Number: 12/31/2023, 4:53 PM   Clinical Narrative:    This CM updated by covering MD pt medically cleared to dc today and has active DC order . This CM spoke with pt at bedside and confirmed she did not have preference for DME company used for nebulizer ordered DC transportation confirmed for pt with family Medical team updated . No additional DC needs requested by medical team or identified by CM at this time .     Final next level of care: Home/Self Care Barriers to Discharge: No Barriers Identified                  Name of family member notified: Patient Patient and family notified of of transfer: 12/31/23  Discharge Plan and Services Additional resources added to the After Visit Summary for                  DME Arranged: Nebulizer machine DME Agency: [employer]     Representative spoke with at DME Agency: [name] @ [phone]            Social Drivers of Health (SDOH) Interventions SDOH Screenings   Food Insecurity: No Food Insecurity (12/27/2023)  Housing: Patient Declined (12/29/2023)  Transportation Needs: Patient Declined (12/29/2023)  Recent Concern: Transportation Needs - Unmet Transportation Needs (12/04/2023)   Received from [facility]  Utilities: Patient Declined (12/29/2023)  Financial Resource Strain: Medium Risk (12/04/2023)   Received from [facility]  Tobacco Use: Medium Risk (12/26/2023)  Health Literacy: Low Risk  (02/15/2021)   Received from [facility]     Readmission Risk Interventions     No data to display

## 2023-12-31 NOTE — TOC Progression Note (Signed)
 Transition of Care [facility]) - Progression Note    Patient Details  Name: Stacey Pope MRN: [id number] Date of Birth: [birth year]  Transition of Care [facility]) CM/SW Contact  Marinda Cooks, RN Phone Number: 12/31/2023, 9:51 AM  Clinical Narrative:    Per chart review pt not medically cleared to dc today. TOC will cont to follow for any dc needs/ care coordination and update as applicable .        Expected Discharge Plan and Services                                               Social Determinants of Health (SDOH) Interventions SDOH Screenings   Food Insecurity: No Food Insecurity (12/27/2023)  Housing: Patient Declined (12/29/2023)  Transportation Needs: Patient Declined (12/29/2023)  Recent Concern: Transportation Needs - Unmet Transportation Needs (12/04/2023)   Received from [facility]  Utilities: Patient Declined (12/29/2023)  Financial Resource Strain: Medium Risk (12/04/2023)   Received from [facility]  Tobacco Use: Medium Risk (12/26/2023)  Health Literacy: Low Risk  (02/15/2021)   Received from [facility]    Readmission Risk Interventions     No data to display

## 2023-12-31 NOTE — Discharge Summary (Signed)
 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 female with medical history significant of asthma, HTN, DM,, GERD, depression with anxiety, obesity, former smoker, chronic back pain with sciatica, who presents with cough, SOB and wheezing.  Patient was admitted for acute asthma exacerbation and continues to have persistent symptoms this morning, but is otherwise on room air.  Urine pregnancy test positive with minimal increase in serum hCG likely related to recent miscarriage.   Pulmonary was consulted.  Patient was given Tussionex which helped with her persistent cough.  No desaturation and chest x-ray was without any infiltrate.  She might need some biologic as outpatient from pulmonary.  Pulmonary adjusted some medications.  Home propranolol  was discontinued.  She was started on low-dose losartan for hypertension.  She was given small amount of Tussionex to use as needed.  She was also given a tapering course of steroid.  She was started on Dulera and she will continue Spiriva.  She was also given nebulizer machine to use at home as needed.  She was given a prescription of DuoNeb to use as needed.  Echocardiogram with normal EF, grade 1 diastolic dysfunction  Patient will continue the rest of her home medications and need to have a close follow-up with her providers for further  assistance.  Pain control - [place]  Controlled Substance Reporting System database was reviewed. and patient was instructed, not to drive, operate heavy machinery, perform activities at heights, swimming or participation in water activities or provide baby-sitting services while on Pain, Sleep and Anxiety Medications; until their outpatient Physician has advised to do so again. Also recommended to not to take more than prescribed Pain, Sleep and Anxiety Medications.  Consultants: Pulmonology Procedures performed: None Disposition: Home Diet recommendation:  Discharge Diet Orders (From admission, onward)     Start     Ordered   12/31/23 0000  Diet - low sodium heart healthy        12/31/23 1152           Cardiac and Carb modified diet DISCHARGE MEDICATION: Allergies as of 12/31/2023       Reactions   Amoxicillin Rash   Augmentin [amoxicillin-pot Clavulanate] Anaphylaxis   Clavulanic Acid         Medication List     STOP taking these medications    amLODipine 5 MG tablet Commonly known as: NORVASC   azithromycin  250 MG tablet Commonly known as: ZITHROMAX    lisinopril  20 MG tablet Commonly known as: ZESTRIL    propranolol  10 MG tablet Commonly known as: INDERAL        TAKE these medications    chlorpheniramine-HYDROcodone  10-8 MG/5ML Commonly known as: TUSSIONEX Take 5 mLs by mouth every 12 (twelve) hours as needed for cough.   clonazePAM  1 MG tablet Commonly known as: KLONOPIN  Take 1 mg by mouth 2 (two) times daily as needed for anxiety.   FLUoxetine  10  MG capsule Commonly known as: PROZAC  Take 20 mg by mouth daily.   hydrOXYzine  25 MG tablet Commonly known as: ATARAX  Take 25 mg by mouth 3 (three) times daily as needed.   ipratropium-albuterol  0.5-2.5 (3) MG/3ML Soln Commonly known as: DUONEB Take 3 mLs by nebulization every 4 (four) hours as needed.   losartan 25 MG tablet Commonly known as: COZAAR Take 1 tablet (25 mg total) by mouth  daily.   mometasone-formoterol 200-5 MCG/ACT Aero Commonly known as: DULERA Inhale 2 puffs into the lungs 2 (two) times daily.   montelukast  10 MG tablet Commonly known as: SINGULAIR  Take 10 mg by mouth daily.   Mounjaro 7.5 MG/0.5ML Pen Generic drug: tirzepatide Inject 7.5 mg into the skin once a week.   ondansetron  4 MG tablet Commonly known as: ZOFRAN  Take 8 mg by mouth every 8 (eight) hours as needed for nausea.   Oxycodone  HCl 10 MG Tabs Take 10 mg by mouth every 6 (six) hours as needed (pain).   pantoprazole  40 MG tablet Commonly known as: PROTONIX  Take 40 mg by mouth daily.   predniSONE  10 MG tablet Commonly known as: DELTASONE  Take 4 tablets (40 mg total) by mouth daily with breakfast. Take 40 mg for next 2 days and then take 30 mg / 3 pills for the next 2 days, take away 1 pill every other day until you finish the course Start taking on: January 01, 2024   promethazine 25 MG tablet Commonly known as: PHENERGAN Take 25 mg by mouth every 6 (six) hours as needed for nausea.   tiotropium 18 MCG inhalation capsule Commonly known as: SPIRIVA Place 1 capsule (18 mcg total) into inhaler and inhale daily.   traZODone  50 MG tablet Commonly known as: DESYREL  Take 100 mg by mouth at bedtime.   Vitamin D (Ergocalciferol) 1.25 MG (50000 UNIT) Caps capsule Commonly known as: DRISDOL Take 1 capsule (50,000 Units total) by mouth every 7 (seven) days. What changed:  medication strength how much to take when to take this               Durable Medical Equipment  (From admission, onward)           Start     Ordered   12/31/23 0000  For home use only DME Nebulizer machine       Question Answer Comment  Patient needs a nebulizer to treat with the following condition Asthma exacerbation   Length of Need Lifetime   Additional equipment included Administration kit   Additional equipment included Filter      12/31/23 1152            Follow-up Information      Tester, Hillary A, PA-C. Schedule an appointment as soon as possible for a visit in 1 week(s).   Specialty: Physician Assistant Contact information: [address] [place] [address] [phone]         Tamea Dedra CROME, MD. Schedule an appointment as soon as possible for a visit in 1 week(s).   Specialty: Pulmonary Disease Contact information: [address] [address] [phone]                Discharge Exam: Stacey Pope   12/26/23 1506  Weight: 99.8 kg   General.  Obese lady, in no acute distress. Pulmonary.  Lungs clear bilaterally, normal respiratory effort. CV.  Regular rate and rhythm, no JVD, rub or murmur. Abdomen.  Soft, nontender, nondistended, BS positive.  CNS.  Alert and oriented .  No focal neurologic deficit. Extremities.  No edema, no cyanosis, pulses intact and symmetrical. Psychiatry.  Judgment and insight appears normal.   Condition at discharge: stable  The results of significant diagnostics from this hospitalization (including imaging, microbiology, ancillary and laboratory) are listed below for reference.   Imaging Studies: DG Chest 2 View Result Date: 12/26/2023 CLINICAL DATA:  wheezing and dyspnea EXAM: CHEST - 2 VIEW COMPARISON:  None available. FINDINGS: No focal airspace consolidation, pleural effusion, or pneumothorax. No cardiomegaly. No acute fracture or destructive lesion. IMPRESSION: No acute cardiopulmonary abnormality. Electronically Signed   By: Rogelia Myers M.D.   On: 12/26/2023 17:56    Microbiology: Results for orders placed or performed during the hospital encounter of 12/26/23  Resp panel by RT-PCR (RSV, Flu A&B, Covid) Anterior Nasal Swab     Status: None   Collection Time: 12/26/23  4:48 PM   Specimen: Anterior Nasal Swab  Result Value Ref Range Status   SARS Coronavirus 2 by RT PCR NEGATIVE NEGATIVE Final    Comment: (NOTE) SARS-CoV-2 target nucleic acids are NOT  DETECTED.  The SARS-CoV-2 RNA is generally detectable in upper respiratory specimens during the acute phase of infection. The lowest concentration of SARS-CoV-2 viral copies this assay can detect is 138 copies/mL. A negative result does not preclude SARS-Cov-2 infection and should not be used as the sole basis for treatment or other patient management decisions. A negative result may occur with  improper specimen collection/handling, submission of specimen other than nasopharyngeal swab, presence of viral mutation(s) within the areas targeted by this assay, and inadequate number of viral copies(<138 copies/mL). A negative result must be combined with clinical observations, patient history, and epidemiological information. The expected result is Negative.  Fact Sheet for Patients:  BloggerCourse.com  Fact Sheet for Healthcare Providers:  SeriousBroker.it  This test is no t yet approved or cleared by the [place]  FDA and  has been authorized for detection and/or diagnosis of SARS-CoV-2 by FDA under an Emergency Use Authorization (EUA). This EUA will remain  in effect (meaning this test can be used) for the duration of the COVID-19 declaration under Section 564(b)(1) of the Act, 21 U.S.C.section 360bbb-3(b)(1), unless the authorization is terminated  or revoked sooner.       Influenza A by PCR NEGATIVE NEGATIVE Final   Influenza B by PCR NEGATIVE NEGATIVE Final    Comment: (NOTE) The Xpert Xpress SARS-CoV-2/FLU/RSV plus assay is intended as an aid in the diagnosis of influenza from Nasopharyngeal swab specimens and should not be used as a sole basis for treatment. Nasal washings and aspirates are unacceptable for Xpert Xpress SARS-CoV-2/FLU/RSV testing.  Fact Sheet for Patients: BloggerCourse.com  Fact Sheet for Healthcare Providers: SeriousBroker.it  This test is not yet  approved or cleared by the [place]  FDA and has been authorized for detection and/or diagnosis of SARS-CoV-2 by FDA under an Emergency Use Authorization (EUA). This EUA will remain in effect (meaning this test can be used) for the duration of the COVID-19 declaration under Section 564(b)(1) of the Act, 21 U.S.C. section 360bbb-3(b)(1), unless the authorization is terminated or revoked.     Resp Syncytial Virus by PCR NEGATIVE NEGATIVE Final    Comment: (NOTE) Fact Sheet for Patients: BloggerCourse.com  Fact Sheet for Healthcare Providers: SeriousBroker.it  This test is not yet approved or cleared by the [place]  FDA and has been authorized for detection and/or diagnosis of SARS-CoV-2 by FDA under an Emergency Use  Authorization (EUA). This EUA will remain in effect (meaning this test can be used) for the duration of the COVID-19 declaration under Section 564(b)(1) of the Act, 21 U.S.C. section 360bbb-3(b)(1), unless the authorization is terminated or revoked.  Performed at [facility], [address] [place], [place] [address]     Labs: CBC: Recent Labs  Lab 12/26/23 1836 12/27/23 0428 12/28/23 0510 12/29/23 0506 12/30/23 0503  WBC 16.8* 11.3* 15.7* 12.0* 15.9*  HGB 13.2 13.0 12.3 13.1 13.8  HCT 40.4 39.8 38.2 40.0 41.4  MCV 92.7 91.7 92.3 91.7 91.2  PLT 309 308 326 317 341   Basic Metabolic Panel: Recent Labs  Lab 12/26/23 1836 12/28/23 0510 12/29/23 0506 12/30/23 0503  NA 135 136 133* 134*  K 3.6 4.1 3.8 4.0  CL 104 104 101 103  CO2 22 23 21* 23  GLUCOSE 94 151* 183* 123*  BUN 9 16 22* 23*  CREATININE 0.48 0.49 0.65 0.55  CALCIUM 9.1 9.4 9.3 9.5  MG  --  2.6* 2.4 2.4   Liver Function Tests: Recent Labs  Lab 12/26/23 1836  AST 12*  ALT 20  ALKPHOS 77  BILITOT 0.8  PROT 6.9  ALBUMIN 3.6   CBG: Recent Labs  Lab 12/30/23 1142 12/30/23 1608 12/30/23 2057 12/31/23 0722  12/31/23 1200  GLUCAP 133* 144* 132* 136* 115*    Discharge time spent: greater than 30 minutes.  This record has been created using Conservation officer, historic buildings. Errors have been sought and corrected,but may not always be located. Such creation errors do not reflect on the standard of care.   Signed: Amaryllis Dare, MD [facility] 12/31/2023

## 2023-12-31 NOTE — Progress Notes (Signed)
   12/31/23 1130  Spiritual Encounters  Type of Visit Initial  Care provided to: Patient  Conversation partners present during encounter Other (comment) Training and development officer)  Reason for visit Routine spiritual support  OnCall Visit No   Chaplain saw patient in the hallway while on the Unit and patient asked for 2 belonging bags for discharge.  Chaplain asked staff where bags are kept and Chaplain brought those back for the patient.  Patient was appreciative.  When Chaplain asked if there was anything else the patient needed, she said there were no other needs at this time.    Rev. Rana M. Nicholaus, M.Div. Chaplain Resident [facility]

## 2024-01-01 ENCOUNTER — Ambulatory Visit: Admitting: Pulmonary Disease

## 2024-01-15 ENCOUNTER — Ambulatory Visit: Admitting: Pulmonary Disease

## 2024-06-19 NOTE — Progress Notes (Signed)
 [facility] at [facility] Visit   The patient reports they are physically located in [place]  and is currently: at home. I conducted a audio/video visit. I spent 22 minutes on the video call with the patient. I spent an additional 10 minutes on pre- and post-visit activities on the date of service .    Assessment/Plan:    There are no diagnoses linked to this encounter.  Assessment & Plan Dependent edema Swelling of feet, particularly in the evening, likely due to prolonged standing and dependent edema. No significant weight gain. No shortness of breath or orthopnea. Current medications include hydrochlorothiazide and losartan . - Prescribed Lasix 20 mg once daily for two weeks. - Advised wearing compression stockings. - Recommended leg elevation when possible. - Advised avoiding salty foods. - Instructed to take Lasix in the morning to avoid nocturia. - If Lasix is effective, resume hydrochlorothiazide after two weeks.  Type 2 diabetes mellitus Previously managed with tirzepatide [place]), discontinued due to financial constraints. Currently managing with dietary modifications. Blood sugar levels reported as high as 130 mg/dL. No significant weight gain reported. - Advised maintaining a low-carb diet, similar to a keto diet, focusing on green vegetables and lean proteins. - Advised avoiding sugary drinks. - Encouraged monitoring blood sugar levels. - Discussed potential resumption of Mounjaro once insurance is obtained.  Hypertension Blood pressure well-controlled with current medications. Recent reading was 137/80 mmHg. - Continue current antihypertensive regimen with hydrochlorothiazide and losartan . - Advised monitoring blood pressure regularly.  Obesity Weight management discussed in the context of diabetes management. No significant weight gain reported. - Encouraged dietary modifications as part of diabetes management.  Asthma Well-controlled with  current medications. - Continue current asthma management with Symbicort, Duoneb, and rescue inhaler.  Chronic low back pain Managed with oxycodone  as needed, which is effective without causing significant side effects. - Continue current pain management with oxycodone  as needed. - Advised use of stool softeners if constipation occurs.  Hypothyroidism Managed with thyroid medication. Reports hair thinning, possibly related to previous Mounjaro use. Thyroid function to be evaluated with blood work. - Ordered thyroid function tests and basic metabolic panel. - Advised scheduling blood work at the clinic. - Recommended over-the-counter biotin, collagen, and slow-release iron supplements after blood work.    No follow-ups on file.   Subjective:   HPI:  HPI History of Present Illness Stacey Pope is a 38 year old female with type 2 diabetes, hypertension, and obesity who presents with swelling of the feet.  She has been experiencing swelling of her feet, which she first noticed before her uncle's death on Thanksgiving. Despite taking hydrochlorothiazide 25 mg daily, the swelling persisted until recently. She reports that she thinks the previous swelling was due to prolonged standing while her uncle was hospitalized. Currently, her feet are not swollen, but her face feels swollen. Her feet tend to swell in the evening but return to normal by morning.  She manages her hypertension with hydrochlorothiazide 25 mg daily and losartan  25 mg daily. Her blood pressure was 137/80 mmHg last night. She manages her type 2 diabetes through dietary modifications after discontinuing tirzepatide [place]de) in August due to financial constraints following job loss. Her highest blood sugar reading has been 130 mg/dL. She avoids sugary drinks and is trying to maintain her weight, which she believes has remained stable since her last visit.  She has chronic low back pain and uses oxycodone  for pain  management, which she has not taken yet today. The  medication helps with her pain without causing significant side effects like constipation, as she uses a stool softener if needed. She also has asthma, which she describes as well-controlled, using Symbicort sparingly and a nebulizer with albuterol  and ipratropium for rescue.  She is concerned about hair loss, which she noticed after a recent haircut. She is taking her thyroid medication regularly and recently received a new bottle. She reports losing significant amounts of hair in the shower.  She works at a [employer] and is currently self-paying for her medical expenses. She is trying to obtain full-time employment to gain insurance coverage. She has attempted to apply for Medicaid but was denied, possibly due to government shutdown issues at the time. She is unaware of the [employer]) and has not explored this option yet.     Review of systems negative unless otherwise noted as per HPI.  Objective:   There were no vitals taken for this visit.  PHYSICAL EXAM:   As part of this Video visit, no in-person exam was conducted, and no vital signs were obtained. Video interaction permitted the following observations:  GEN: no acute distress SKIN: color is normal, no rashes visible PSYCH: appropriate affect, normal mood RESP: normal work of breathing, no respiratory distress  Buel Mow, FNP Note - This record has been created using [employer]. Chart creation errors have been sought, but may not always have been located. Such creation errors do not reflect on the standard of medical care.

## 2024-07-09 ENCOUNTER — Emergency Department: Payer: Self-pay

## 2024-07-09 ENCOUNTER — Inpatient Hospital Stay
Admission: EM | Admit: 2024-07-09 | Discharge: 2024-07-14 | DRG: 440 | Disposition: A | Discharge status: Home / Self Care | Payer: MEDICAID | Attending: Internal Medicine | Admitting: Internal Medicine

## 2024-07-09 DIAGNOSIS — R160 Hepatomegaly, not elsewhere classified: Secondary | ICD-10-CM | POA: Diagnosis present

## 2024-07-09 DIAGNOSIS — F1410 Cocaine abuse, uncomplicated: Secondary | ICD-10-CM | POA: Diagnosis present

## 2024-07-09 DIAGNOSIS — R109 Unspecified abdominal pain: Secondary | ICD-10-CM

## 2024-07-09 DIAGNOSIS — Z87891 Personal history of nicotine dependence: Secondary | ICD-10-CM

## 2024-07-09 DIAGNOSIS — J4540 Moderate persistent asthma, uncomplicated: Secondary | ICD-10-CM | POA: Diagnosis present

## 2024-07-09 DIAGNOSIS — Z8249 Family history of ischemic heart disease and other diseases of the circulatory system: Secondary | ICD-10-CM

## 2024-07-09 DIAGNOSIS — Z79899 Other long term (current) drug therapy: Secondary | ICD-10-CM

## 2024-07-09 DIAGNOSIS — Z6837 Body mass index (BMI) 37.0-37.9, adult: Secondary | ICD-10-CM

## 2024-07-09 DIAGNOSIS — Z79891 Long term (current) use of opiate analgesic: Secondary | ICD-10-CM

## 2024-07-09 DIAGNOSIS — Z7951 Long term (current) use of inhaled steroids: Secondary | ICD-10-CM

## 2024-07-09 DIAGNOSIS — F1910 Other psychoactive substance abuse, uncomplicated: Secondary | ICD-10-CM | POA: Diagnosis present

## 2024-07-09 DIAGNOSIS — E119 Type 2 diabetes mellitus without complications: Secondary | ICD-10-CM | POA: Diagnosis present

## 2024-07-09 DIAGNOSIS — G43909 Migraine, unspecified, not intractable, without status migrainosus: Secondary | ICD-10-CM | POA: Diagnosis present

## 2024-07-09 DIAGNOSIS — R8290 Unspecified abnormal findings in urine: Secondary | ICD-10-CM | POA: Diagnosis present

## 2024-07-09 DIAGNOSIS — E785 Hyperlipidemia, unspecified: Secondary | ICD-10-CM | POA: Diagnosis present

## 2024-07-09 DIAGNOSIS — Z833 Family history of diabetes mellitus: Secondary | ICD-10-CM

## 2024-07-09 DIAGNOSIS — I10 Essential (primary) hypertension: Secondary | ICD-10-CM | POA: Diagnosis present

## 2024-07-09 DIAGNOSIS — R112 Nausea with vomiting, unspecified: Secondary | ICD-10-CM

## 2024-07-09 DIAGNOSIS — Z7985 Long-term (current) use of injectable non-insulin antidiabetic drugs: Secondary | ICD-10-CM

## 2024-07-09 DIAGNOSIS — G8929 Other chronic pain: Secondary | ICD-10-CM | POA: Diagnosis present

## 2024-07-09 DIAGNOSIS — K859 Acute pancreatitis without necrosis or infection, unspecified: Principal | ICD-10-CM | POA: Diagnosis present

## 2024-07-09 DIAGNOSIS — F1510 Other stimulant abuse, uncomplicated: Secondary | ICD-10-CM | POA: Diagnosis present

## 2024-07-09 LAB — URINALYSIS, ROUTINE W REFLEX MICROSCOPIC
Bilirubin Urine: NEGATIVE
Glucose, UA: NEGATIVE mg/dL
Ketones, ur: 20 mg/dL — AB
Leukocytes,Ua: NEGATIVE
Nitrite: POSITIVE — AB
Protein, ur: 30 mg/dL — AB
Specific Gravity, Urine: 1.018 (ref 1.005–1.030)
pH: 6 (ref 5.0–8.0)

## 2024-07-09 LAB — COMPREHENSIVE METABOLIC PANEL WITH GFR
ALT: 21 U/L (ref 0–44)
AST: 33 U/L (ref 15–41)
Albumin: 4.4 g/dL (ref 3.5–5.0)
Alkaline Phosphatase: 88 U/L (ref 38–126)
Anion gap: 17 — ABNORMAL HIGH (ref 5–15)
BUN: 9 mg/dL (ref 6–20)
CO2: 21 mmol/L — ABNORMAL LOW (ref 22–32)
Calcium: 9.2 mg/dL (ref 8.9–10.3)
Chloride: 102 mmol/L (ref 98–111)
Creatinine, Ser: 0.57 mg/dL (ref 0.44–1.00)
GFR, Estimated: >60 mL/min
Glucose, Bld: 152 mg/dL — ABNORMAL HIGH (ref 70–99)
Potassium: 3.4 mmol/L — ABNORMAL LOW (ref 3.5–5.1)
Sodium: 139 mmol/L (ref 135–145)
Total Bilirubin: 1 mg/dL (ref 0.0–1.2)
Total Protein: 7.2 g/dL (ref 6.5–8.1)

## 2024-07-09 LAB — URINE DRUG SCREEN
Amphetamines: POSITIVE — AB
Barbiturates: NEGATIVE
Benzodiazepines: POSITIVE — AB
Cocaine: POSITIVE — AB
Fentanyl: NEGATIVE
Methadone Scn, Ur: NEGATIVE
Opiates: POSITIVE — AB
Tetrahydrocannabinol: POSITIVE — AB

## 2024-07-09 LAB — CBC
HCT: 43.1 % (ref 36.0–46.0)
Hemoglobin: 14.4 g/dL (ref 12.0–15.0)
MCH: 29.9 pg (ref 26.0–34.0)
MCHC: 33.4 g/dL (ref 30.0–36.0)
MCV: 89.4 fL (ref 80.0–100.0)
Platelets: 334 K/uL (ref 150–400)
RBC: 4.82 MIL/uL (ref 3.87–5.11)
RDW: 14.6 % (ref 11.5–15.5)
WBC: 15.9 K/uL — ABNORMAL HIGH (ref 4.0–10.5)
nRBC: 0 % (ref 0.0–0.2)

## 2024-07-09 LAB — POC URINE PREG, ED: Preg Test, Ur: NEGATIVE

## 2024-07-09 LAB — TRIGLYCERIDES: Triglycerides: 181 mg/dL — ABNORMAL HIGH

## 2024-07-09 LAB — HCG, QUANTITATIVE, PREGNANCY: hCG, Beta Chain, Quant, S: 1 m[IU]/mL

## 2024-07-09 LAB — CBG MONITORING, ED
Glucose-Capillary: 152 mg/dL — ABNORMAL HIGH (ref 70–99)
Glucose-Capillary: 116 mg/dL — ABNORMAL HIGH (ref 70–99)

## 2024-07-09 LAB — LIPASE, BLOOD: Lipase: 1188 U/L — ABNORMAL HIGH (ref 11–51)

## 2024-07-09 MED ORDER — MORPHINE SULFATE (PF) 2 MG/ML IV SOLN
2.0000 mg | INTRAVENOUS | Status: DC | PRN
  Administered 2024-07-10: 2 mg via INTRAVENOUS
  Filled 2024-07-09 (×3): qty 1

## 2024-07-09 MED ORDER — ACETAMINOPHEN 325 MG PO TABS: 650.0000 mg | ORAL_TABLET | Freq: Four times a day (QID) | ORAL | Status: DC | PRN

## 2024-07-09 MED ORDER — ENOXAPARIN SODIUM 60 MG/0.6ML IJ SOSY
0.5000 mg/kg | PREFILLED_SYRINGE | INTRAMUSCULAR | Status: DC
  Administered 2024-07-09: 52.5 mg via SUBCUTANEOUS
  Filled 2024-07-09: qty 0.6

## 2024-07-09 MED ORDER — HYDROXYZINE HCL 25 MG PO TABS
25.0000 mg | ORAL_TABLET | Freq: Three times a day (TID) | ORAL | Status: DC | PRN
  Administered 2024-07-10 – 2024-07-12 (×3): 25 mg via ORAL
  Filled 2024-07-09 (×3): qty 1

## 2024-07-09 MED ORDER — HYDROMORPHONE HCL 1 MG/ML IJ SOLN
1.0000 mg | Freq: Once | INTRAMUSCULAR | Status: AC
  Administered 2024-07-09: 1 mg via INTRAVENOUS
  Filled 2024-07-09: qty 1

## 2024-07-09 MED ORDER — SODIUM CHLORIDE 0.9 % IV SOLN: INTRAVENOUS | Status: AC

## 2024-07-09 MED ORDER — KETOROLAC TROMETHAMINE 15 MG/ML IJ SOLN
15.0000 mg | Freq: Once | INTRAMUSCULAR | Status: AC
  Administered 2024-07-09: 15 mg via INTRAVENOUS
  Filled 2024-07-09: qty 1

## 2024-07-09 MED ORDER — ACETAMINOPHEN 650 MG RE SUPP: 650.0000 mg | Freq: Four times a day (QID) | RECTAL | Status: DC | PRN

## 2024-07-09 MED ORDER — ONDANSETRON HCL 4 MG/2ML IJ SOLN
4.0000 mg | Freq: Once | INTRAMUSCULAR | Status: AC
  Administered 2024-07-09: 4 mg via INTRAVENOUS
  Filled 2024-07-09: qty 2

## 2024-07-09 MED ORDER — IOHEXOL 300 MG/ML  SOLN
100.0000 mL | Freq: Once | INTRAMUSCULAR | Status: AC | PRN
  Administered 2024-07-09: 100 mL via INTRAVENOUS

## 2024-07-09 MED ORDER — PANTOPRAZOLE SODIUM 40 MG IV SOLR
40.0000 mg | INTRAVENOUS | Status: DC
  Administered 2024-07-09 – 2024-07-11 (×3): 40 mg via INTRAVENOUS
  Filled 2024-07-09 (×3): qty 10

## 2024-07-09 MED ORDER — OXYCODONE HCL 5 MG PO TABS
5.0000 mg | ORAL_TABLET | ORAL | Status: DC | PRN
  Administered 2024-07-10 – 2024-07-13 (×11): 5 mg via ORAL
  Filled 2024-07-09 (×11): qty 1

## 2024-07-09 MED ORDER — ONDANSETRON HCL 4 MG PO TABS: 4.0000 mg | ORAL_TABLET | Freq: Four times a day (QID) | ORAL | Status: DC | PRN

## 2024-07-09 MED ORDER — FLUOXETINE HCL 20 MG PO CAPS
20.0000 mg | ORAL_CAPSULE | Freq: Every day | ORAL | Status: DC
  Administered 2024-07-09 – 2024-07-14 (×6): 20 mg via ORAL
  Filled 2024-07-09 (×6): qty 1

## 2024-07-09 MED ORDER — SODIUM CHLORIDE 0.9 % IV SOLN
12.5000 mg | Freq: Four times a day (QID) | INTRAVENOUS | Status: DC | PRN
  Administered 2024-07-11: 12.5 mg via INTRAVENOUS
  Filled 2024-07-09: qty 12.5

## 2024-07-09 MED ORDER — SODIUM CHLORIDE 0.9 % IV BOLUS
1000.0000 mL | Freq: Once | INTRAVENOUS | Status: AC
  Administered 2024-07-09: 1000 mL via INTRAVENOUS

## 2024-07-09 MED ORDER — KETOROLAC TROMETHAMINE 30 MG/ML IJ SOLN
30.0000 mg | Freq: Four times a day (QID) | INTRAMUSCULAR | Status: DC | PRN
  Administered 2024-07-10: 30 mg via INTRAVENOUS
  Filled 2024-07-09: qty 1

## 2024-07-09 MED ORDER — ALBUTEROL SULFATE (2.5 MG/3ML) 0.083% IN NEBU: 2.5000 mg | INHALATION_SOLUTION | RESPIRATORY_TRACT | Status: DC | PRN

## 2024-07-09 MED ORDER — ONDANSETRON HCL 4 MG/2ML IJ SOLN
4.0000 mg | Freq: Four times a day (QID) | INTRAMUSCULAR | Status: DC | PRN
  Administered 2024-07-10 – 2024-07-13 (×4): 4 mg via INTRAVENOUS
  Filled 2024-07-09 (×4): qty 2

## 2024-07-09 MED ORDER — LOSARTAN POTASSIUM 25 MG PO TABS
25.0000 mg | ORAL_TABLET | Freq: Every day | ORAL | Status: DC
  Administered 2024-07-09 – 2024-07-14 (×6): 25 mg via ORAL
  Filled 2024-07-09 (×6): qty 1

## 2024-07-09 MED ORDER — HALOPERIDOL LACTATE 5 MG/ML IJ SOLN
5.0000 mg | Freq: Once | INTRAMUSCULAR | Status: AC
  Administered 2024-07-09: 5 mg via INTRAVENOUS
  Filled 2024-07-09: qty 1

## 2024-07-09 MED ORDER — INSULIN ASPART 100 UNIT/ML IJ SOLN
0.0000 [IU] | Freq: Three times a day (TID) | INTRAMUSCULAR | Status: DC
  Administered 2024-07-12: 3 [IU] via SUBCUTANEOUS
  Administered 2024-07-13: 1 [IU] via SUBCUTANEOUS
  Filled 2024-07-09: qty 3
  Filled 2024-07-09: qty 1

## 2024-07-09 MED ORDER — SODIUM CHLORIDE 0.9 % IV SOLN
25.0000 mg | Freq: Four times a day (QID) | INTRAVENOUS | Status: DC | PRN
  Administered 2024-07-09: 25 mg via INTRAVENOUS
  Filled 2024-07-09: qty 25

## 2024-07-09 MED ORDER — INSULIN ASPART 100 UNIT/ML IJ SOLN: 0.0000 [IU] | Freq: Every day | INTRAMUSCULAR | Status: DC

## 2024-07-09 NOTE — Assessment & Plan Note (Signed)
 Presents with abdominal pain nausea and vomiting, lipase 1188 and CT showing pancreatitis.  Normal LFTs and right upper quadrant with no acute findings Pain meds, IV antiemetics and IV Protonix  Clear liquid diet Follow-up triglyceride level-->180 Patient denies any alcohol use

## 2024-07-09 NOTE — Assessment & Plan Note (Signed)
 Multiple substances noted on UDS(opiates, cocaine, benzodiazepines, amphetamines and tetrahydrocannabinol's) Will need counseling on abstinence

## 2024-07-09 NOTE — Assessment & Plan Note (Signed)
 Sliding scale insulin  coverage

## 2024-07-09 NOTE — ED Triage Notes (Signed)
 Pt to first nurse desk reporting worsening abdominal pain and stating her blood sugar is dropping. CBG obtained, 152. Back to lobby to wait for room. Appears uncomfortable, A&O X 4 and ambulatory.

## 2024-07-09 NOTE — Assessment & Plan Note (Addendum)
 UA with nitrites, WBC 15,000 Follow-up urine culture Holding off antibiotics for now

## 2024-07-09 NOTE — Assessment & Plan Note (Signed)
"   No acute issues.         "

## 2024-07-09 NOTE — ED Provider Notes (Signed)
 "  [facility] Provider Note    Event Date/Time   First MD Initiated Contact with Patient 07/09/24 1338     (approximate)   History   Abdominal Pain   HPI  Stacey Pope is a 38 y.o. female with morbid obesity, chronic low back pain, hypertension asthma hyper lipidemia who presents with epigastric abdominal pain that began suddenly this morning.  There is associated nausea and vomiting.  Denies any changes in urinary or bowel habits.  Denies any fevers or chills.  Has no history of abdominal surgeries.  No vaginal discharge.  Denies any substance use      Physical Exam   Triage Vital Signs: ED Triage Vitals  Encounter Vitals Group     BP 07/09/24 1243 (!) 177/102     Girls Systolic BP Percentile --      Girls Diastolic BP Percentile --      Boys Systolic BP Percentile --      Boys Diastolic BP Percentile --      Pulse Rate 07/09/24 1243 75     Resp 07/09/24 1243 18     Temp 07/09/24 1243 (!) 97.5 F (36.4 C)     Temp Source 07/09/24 1243 Oral     SpO2 07/09/24 1243 100 %     Weight 07/09/24 1244 230 lb (104.3 kg)     Height 07/09/24 1244 5' 6 (1.676 m)     Head Circumference --      Peak Flow --      Pain Score 07/09/24 1244 10     Pain Loc --      Pain Education --      Exclude from Growth Chart --     Most recent vital signs: Vitals:   07/09/24 1243  BP: (!) 177/102  Pulse: 75  Resp: 18  Temp: (!) 97.5 F (36.4 C)  SpO2: 100%    Nursing Triage Note reviewed. Vital signs reviewed and patients oxygen saturation is normoxic  General: Patient is well nourished, well developed, awake and alert, appears unwell, crying out in pain Head: Normocephalic and atraumatic Eyes: Normal inspection, extraocular muscles intact, no conjunctival pallor Ear, nose, throat: Normal external exam Neck: Normal range of motion Respiratory: Patient is in no respiratory distress, lungs CTAB Cardiovascular: Patient is not tachycardic, RRR without murmur  appreciated GI: Abd soft, ttp epigastrically with no guarding or rebound  Back: Normal inspection of the back with good strength and range of motion throughout all ext Extremities: pulses intact with good cap refills, no LE pitting edema or calf tenderness Neuro: The patient is alert and oriented to person, place, and time, appropriately conversive, with 5/5 bilat UE/LE strength, no gross motor or sensory defects noted. Coordination appears to be adequate. Skin: Warm, dry, and intact Psych: normal mood and affect, no SI or HI  ED Results / Procedures / Treatments   Labs (all labs ordered are listed, but only abnormal results are displayed) Labs Reviewed  LIPASE, BLOOD - Abnormal; Notable for the following components:      Result Value   Lipase 1,188 (*)    All other components within normal limits  COMPREHENSIVE METABOLIC PANEL WITH GFR - Abnormal; Notable for the following components:   Potassium 3.4 (*)    CO2 21 (*)    Glucose, Bld 152 (*)    Anion gap 17 (*)    All other components within normal limits  CBC - Abnormal; Notable for the following components:  WBC 15.9 (*)    All other components within normal limits  CBG MONITORING, ED - Abnormal; Notable for the following components:   Glucose-Capillary 152 (*)    All other components within normal limits  URINALYSIS, ROUTINE W REFLEX MICROSCOPIC  URINE DRUG SCREEN  HCG, QUANTITATIVE, PREGNANCY  TRIGLYCERIDES  POC URINE PREG, ED     EKG None  RADIOLOGY US  abdomen: Pending CT abdomen pelvis: Pending    PROCEDURES:  Critical Care performed: No  Procedures   MEDICATIONS ORDERED IN ED: Medications  iohexol  (OMNIPAQUE ) 300 MG/ML solution 100 mL (has no administration in time range)  HYDROmorphone (DILAUDID) injection 1 mg (1 mg Intravenous Given 07/09/24 1411)  ondansetron  (ZOFRAN ) injection 4 mg (4 mg Intravenous Given 07/09/24 1411)  ketorolac  (TORADOL ) 15 MG/ML injection 15 mg (15 mg Intravenous Given  07/09/24 1411)  sodium chloride  0.9 % bolus 1,000 mL (1,000 mLs Intravenous New Bag/Given 07/09/24 1412)     IMPRESSION / MDM / ASSESSMENT AND PLAN / ED COURSE                                Differential diagnosis includes, but is not limited to, pancreatitis, gastritis, cholecystitis, electrolyte derangement anemia, atypical pyelonephritis  ED course: Patient presents acutely screaming out in pain however abdominal exam demonstrates no evidence of peritonitis.  Multimodal pain control initiated with Dilaudid, Toradol , Zofran , IV fluids.  Patient's labs notable for white blood cell count and a profoundly elevated lipase but no elevation of her bilirubin or LFTs.  Is unclear whether this is a viral initiated pancreatitis, gallstone pancreatitis or any other abnormality.  I have ordered a right upper quadrant ultrasound along with a CT abdomen pelvis with IV contrast.  Disposition is pending these imaging diagnostics.  Anticipate patient will need to be signed out to oncoming physician at 3 PM   Clinical Course as of 07/09/24 1516  Wed Jul 09, 2024  1349 WBC(!): 15.9 Elevated white blood cell count [HD]  1349 Lipase(!): 1,188 Very elevated [HD]    Clinical Course User Index [HD] Nicholaus Rolland BRAVO, MD   -- Risk: 5 This patient has a high risk of morbidity due to further diagnostic testing or treatment. Rationale: This patients evaluation and management involve a high risk of morbidity due to the potential severity of presenting symptoms, need for diagnostic testing, and/or initiation of treatment that may require close monitoring. The differential includes conditions with potential for significant deterioration or requiring escalation of care. Treatment decisions in the ED, including medication administration, procedural interventions, or disposition planning, reflect this level of risk. COPA: 5 The patient has the following acute or chronic illness/injury that poses a possible threat to  life or bodily function: [X] : The patient has a potentially serious acute condition or an acute exacerbation of a chronic illness requiring urgent evaluation and management in the Emergency Department. The clinical presentation necessitates immediate consideration of life-threatening or function-threatening diagnoses, even if they are ultimately ruled out.   FINAL CLINICAL IMPRESSION(S) / ED DIAGNOSES   Final diagnoses:  Acute pancreatitis, unspecified complication status, unspecified pancreatitis type  Abdominal pain, unspecified abdominal location  Nausea and vomiting, unspecified vomiting type     Rx / DC Orders   ED Discharge Orders     None        Note:  This document was prepared using Dragon voice recognition software and may include unintentional dictation errors.   Nicholaus Rolland  E, MD 07/09/24 1516  "

## 2024-07-09 NOTE — ED Triage Notes (Signed)
 Pt presents to the ED via [facility] from home with RUQ abdominal pain that started this morning. Pt reporte N/V. Denies diarrhea. Pt A&Ox4.

## 2024-07-09 NOTE — Hospital Course (Signed)
 SABRA

## 2024-07-09 NOTE — Assessment & Plan Note (Signed)
 Not acutely exacerbated Albuterol  as needed Continue home inhalers

## 2024-07-09 NOTE — H&P (Signed)
 " History and Physical    Patient: Stacey Pope FMW:[id number] DOB: 1985-11-07 DOA: 07/09/2024 DOS: the patient was seen and examined on 07/09/2024 PCP: Tester, Rolland LABOR, PA-C  Patient coming from: Home  Chief Complaint:  Chief Complaint  Patient presents with   Abdominal Pain    HPI: Stacey Pope is a 38 y.o. female with medical history significant for Diabetes, chronic back pain on opiates being admitted with acute pancreatitis of uncertain etiology.  She presented with a 1 day history of abdominal pain nausea and vomiting.  She denies fever, dysuria, change in bowel habits, chest pain or shortness of breath.  Denies alcohol use. In the ED, hypertensive with otherwise normal vitals Labs notable for lipase of 1188 with normal LFTs.  WBC 16,000.  UDS positive for multiple agents (opiates, cocaine, benzodiazepines, amphetamines and tetrahydrocannabinol's).  Urinalysis with positive nitrites but patient denies dysuria. Triglyceride level pending-->180 CT abdomen and pelvis showed acute pancreatitis and hepatomegaly right upper quadrant ultrasound showed no acute findings. Patient treated with Toradol , hydromorphone x 2, Zofran  and given fluid boluses but continued to be in pain. Urine culture sent.  Antibiotics not started Admission requested.     Review of Systems: As mentioned in the history of present illness. All other systems reviewed and are negative.  Past Medical History:  Diagnosis Date   Arthritis    Asthma    Diabetes mellitus without complication (HCC)    Herniated lumbar intervertebral disc    Hypertension    Past Surgical History:  Procedure Laterality Date   BACK SURGERY Right    Social History:  reports that she has quit smoking. Her smoking use included cigars. She has never used smokeless tobacco. She reports that she does not drink alcohol and does not use drugs.  Allergies[1]  Family History  Problem Relation Age of Onset   Hypertension Mother     Diabetes Mother    Lung cancer Mother    Heart disease Father    Hypertension Sister    Diabetes Sister     Prior to Admission medications  Medication Sig Start Date End Date Taking? Authorizing Provider  chlorpheniramine-HYDROcodone  (TUSSIONEX) 10-8 MG/5ML Take 5 mLs by mouth every 12 (twelve) hours as needed for cough. 12/31/23   Caleen Qualia, MD  clonazePAM  (KLONOPIN ) 1 MG tablet Take 1 mg by mouth 2 (two) times daily as needed for anxiety. 11/28/23 12/28/23  [provider]  FLUoxetine  (PROZAC ) 10 MG capsule Take 20 mg by mouth daily. 12/23/23   [provider]  hydrOXYzine  (ATARAX ) 25 MG tablet Take 25 mg by mouth 3 (three) times daily as needed. 12/20/23   [provider]  ipratropium-albuterol  (DUONEB) 0.5-2.5 (3) MG/3ML SOLN Take 3 mLs by nebulization every 4 (four) hours as needed. 12/31/23   Amin, Sumayya, MD  losartan  (COZAAR ) 25 MG tablet Take 1 tablet (25 mg total) by mouth daily. 12/31/23   Amin, Sumayya, MD  mometasone -formoterol  (DULERA) 200-5 MCG/ACT AERO Inhale 2 puffs into the lungs 2 (two) times daily. 12/31/23   Caleen Qualia, MD  montelukast  (SINGULAIR ) 10 MG tablet Take 10 mg by mouth daily. 06/11/23   [provider]  MOUNJARO 7.5 MG/0.5ML Pen Inject 7.5 mg into the skin once a week. 11/13/23   [provider]  predniSONE  (DELTASONE ) 10 MG tablet Take 4 tablets (40 mg total) by mouth daily with breakfast. Take 40 mg for next 2 days and then take 30 mg / 3 pills for the next 2 days,  take away 1 pill every other day until you finish the course 01/01/24   Amin, Sumayya, MD  promethazine (PHENERGAN) 25 MG tablet Take 25 mg by mouth every 6 (six) hours as needed for nausea. 04/05/23 04/04/24  [provider]  tiotropium (SPIRIVA ) 18 MCG inhalation capsule Place 1 capsule (18 mcg total) into inhaler and inhale daily. 12/31/23   Caleen Qualia, MD  Vitamin D , Ergocalciferol , (DRISDOL ) 1.25 MG (50000 UNIT) CAPS capsule Take 1 capsule (50,000  Units total) by mouth every 7 (seven) days. 12/31/23   Caleen Qualia, MD    Physical Exam: Vitals:   07/09/24 1243 07/09/24 1244 07/09/24 1549 07/09/24 1915  BP: (!) 177/102  (!) 184/100 (!) 132/108  Pulse: 75  72 92  Resp: 18  18 19   Temp: (!) 97.5 F (36.4 C)   98.2 F (36.8 C)  TempSrc: Oral   Oral  SpO2: 100%  100% 100%  Weight:  104.3 kg    Height:  5' 6 (1.676 m)     Physical Exam Vitals and nursing note reviewed.  Constitutional:      General: She is not in acute distress. HENT:     Head: Normocephalic and atraumatic.  Cardiovascular:     Rate and Rhythm: Normal rate and regular rhythm.     Heart sounds: Normal heart sounds.  Pulmonary:     Effort: Pulmonary effort is normal.     Breath sounds: Normal breath sounds.  Abdominal:     Palpations: Abdomen is soft.     Tenderness: There is abdominal tenderness in the epigastric area.  Neurological:     Mental Status: Mental status is at baseline.     Labs on Admission: I have personally reviewed following labs and imaging studies  CBC: Recent Labs  Lab 07/09/24 1243  WBC 15.9*  HGB 14.4  HCT 43.1  MCV 89.4  PLT 334   Basic Metabolic Panel: Recent Labs  Lab 07/09/24 1243  NA 139  K 3.4*  CL 102  CO2 21*  GLUCOSE 152*  BUN 9  CREATININE 0.57  CALCIUM 9.2   GFR: Estimated Creatinine Clearance: 117.5 mL/min (by C-G formula based on SCr of 0.57 mg/dL). Liver Function Tests: Recent Labs  Lab 07/09/24 1243  AST 33  ALT 21  ALKPHOS 88  BILITOT 1.0  PROT 7.2  ALBUMIN 4.4   Recent Labs  Lab 07/09/24 1243  LIPASE 1,188*   No results for input(s): AMMONIA in the last 168 hours. Coagulation Profile: No results for input(s): INR, PROTIME in the last 168 hours. Cardiac Enzymes: No results for input(s): CKTOTAL, CKMB, CKMBINDEX, TROPONINI in the last 168 hours. BNP (last 3 results) No results for input(s): PROBNP in the last 8760 hours. HbA1C: No results for input(s): HGBA1C  in the last 72 hours. CBG: Recent Labs  Lab 07/09/24 1317  GLUCAP 152*   Lipid Profile: No results for input(s): CHOL, HDL, LDLCALC, TRIG, CHOLHDL, LDLDIRECT in the last 72 hours. Thyroid Function Tests: No results for input(s): TSH, T4TOTAL, FREET4, T3FREE, THYROIDAB in the last 72 hours. Anemia Panel: No results for input(s): VITAMINB12, FOLATE, FERRITIN, TIBC, IRON, RETICCTPCT in the last 72 hours. Urine analysis:    Component Value Date/Time   COLORURINE AMBER (A) 07/09/2024 1551   APPEARANCEUR CLOUDY (A) 07/09/2024 1551   APPEARANCEUR Cloudy 03/15/2014 0024   LABSPEC 1.018 07/09/2024 1551   LABSPEC 1.032 03/15/2014 0024   PHURINE 6.0 07/09/2024 1551   GLUCOSEU NEGATIVE 07/09/2024 1551   GLUCOSEU Negative  03/15/2014 0024   HGBUR SMALL (A) 07/09/2024 1551   BILIRUBINUR NEGATIVE 07/09/2024 1551   BILIRUBINUR Negative 03/15/2014 0024   KETONESUR 20 (A) 07/09/2024 1551   PROTEINUR 30 (A) 07/09/2024 1551   NITRITE POSITIVE (A) 07/09/2024 1551   LEUKOCYTESUR NEGATIVE 07/09/2024 1551   LEUKOCYTESUR 3+ 03/15/2014 0024    Radiological Exams on Admission: CT ABDOMEN PELVIS W CONTRAST Result Date: 07/09/2024 EXAM: CT ABDOMEN AND PELVIS WITH CONTRAST 07/09/2024 04:45:01 PM TECHNIQUE: CT of the abdomen and pelvis was performed with the administration of 100 mL of iohexol  (OMNIPAQUE ) 300 MG/ML solution. Multiplanar reformatted images are provided for review. Automated exposure control, iterative reconstruction, and/or weight-based adjustment of the mA/kV was utilized to reduce the radiation dose to as low as reasonably achievable. COMPARISON: CT abdomen and pelvis 10/03/2021. CLINICAL HISTORY: elevated lipase. FINDINGS: LOWER CHEST: No acute abnormality. LIVER: The liver is enlarged, measuring up to 22 cm. GALLBLADDER AND BILE DUCTS: Gallbladder is unremarkable. No biliary ductal dilatation. SPLEEN: No acute abnormality. PANCREAS: Diffuse peripancreatic  fat stranding and free fluid. The pancreatic parenchyma enhances. No pancreatic pseudocysts formation. ADRENAL GLANDS: No acute abnormality. KIDNEYS, URETERS AND BLADDER: No stones in the kidneys or ureters. No hydronephrosis. No perinephric or periureteral stranding. Urinary bladder is unremarkable. GI AND BOWEL: Stomach demonstrates no acute abnormality. No small or large bowel thickening or dilatation. The appendix is unremarkable. There is no bowel obstruction. PERITONEUM AND RETROPERITONEUM: No free air. VASCULATURE: Aorta is normal in caliber. LYMPH NODES: No lymphadenopathy. REPRODUCTIVE ORGANS: The uterus is unremarkable. No adnexal mass. BONES AND SOFT TISSUES: L5-S1 intervertebral disc space vacuum phenomenon with endplate sclerosis. No focal soft tissue abnormality. IMPRESSION: 1. Acute pancreatitis. 2. Hepatomegaly. Electronically signed by: Morgane Naveau MD 07/09/2024 05:44 PM EST RP Workstation: [id number]   US  ABDOMEN LIMITED RUQ (LIVER/GB) Result Date: 07/09/2024 EXAM: Right Upper Quadrant Abdominal Ultrasound 07/09/2024 02:53:36 PM TECHNIQUE: Real-time ultrasonography of the right upper quadrant of the abdomen was performed. COMPARISON: US  Abdomen 12/13/2006 CLINICAL HISTORY: Abnormal serum level of lipase. FINDINGS: LIVER: Normal echogenicity. No intrahepatic biliary ductal dilatation. No evidence of mass. Hepatopetal flow in the portal vein. BILIARY SYSTEM: Gallbladder wall thickness measures 1.6 mm. No pericholecystic fluid. No cholelithiasis. The common bile duct measures 5.6 mm. OTHER: No right upper quadrant ascites. IMPRESSION: 1. No acute right upper quadrant ultrasound findings. Electronically signed by: Vanetta Chou MD 07/09/2024 03:11 PM EST RP Workstation: [id number]   Data Reviewed for HPI: Relevant notes from primary care and specialist visits, past discharge summaries as available in EHR, including Care Everywhere. Prior diagnostic testing as pertinent to current admission  diagnoses Updated medications and problem lists for reconciliation ED course, including vitals, labs, imaging, treatment and response to treatment Triage notes, nursing and pharmacy notes and ED provider's notes Notable results as noted above in HPI      Assessment and Plan: * Acute pancreatitis Presents with abdominal pain nausea and vomiting, lipase 1188 and CT showing pancreatitis.  Normal LFTs and right upper quadrant with no acute findings Pain meds, IV antiemetics and IV Protonix  Clear liquid diet Follow-up triglyceride level-->180 Patient denies any alcohol use  Abnormal urinalysis UA with nitrites, WBC 15,000 Follow-up urine culture Holding off antibiotics for now  Polysubstance abuse (HCC) Multiple substances noted on UDS(opiates, cocaine, benzodiazepines, amphetamines and tetrahydrocannabinol's) Patient adamantly denies cocaine use or other illicit drug use She is on chronic opiates  Diabetes mellitus without complication (HCC) Sliding scale insulin  coverage  HTN (hypertension) Continue home Cozaar  Hydralazine  IV for additional  BP control  Chronic back pain On chronic opiates  Moderate persistent asthma without complication Not acutely exacerbated Albuterol  as needed Continue home inhalers  Migraine No acute issues     DVT prophylaxis: Lovenox   Consults: none  Advance Care Planning:   Code Status: Prior   Family Communication: none  Disposition Plan: Back to previous home environment  Severity of Illness: The appropriate patient status for this patient is OBSERVATION. Observation status is judged to be reasonable and necessary in order to provide the required intensity of service to ensure the patient's safety. The patient's presenting symptoms, physical exam findings, and initial radiographic and laboratory data in the context of their medical condition is felt to place them at decreased risk for further clinical deterioration. Furthermore, it is  anticipated that the patient will be medically stable for discharge from the hospital within 2 midnights of admission.   Author: Delayne LULLA Solian, MD 07/09/2024 8:19 PM  For on call review [email or web address].      [1]  Allergies Allergen Reactions   Amoxicillin Rash   Augmentin [Amoxicillin-Pot Clavulanate] Anaphylaxis   Clavulanic Acid    "

## 2024-07-09 NOTE — Assessment & Plan Note (Signed)
 Continue home Cozaar  Hydralazine  IV for additional BP control

## 2024-07-09 NOTE — ED Triage Notes (Signed)
 Arrived by [facility] from home with c/o abdominal pain. Began around 0930 today with N/V. Takes blood pressure medication  EMS administered: 20 G L AC 4mg  zofran   EMS vitals: 60HR 100% RA 191/111 b/p

## 2024-07-10 ENCOUNTER — Other Ambulatory Visit: Payer: Self-pay

## 2024-07-10 DIAGNOSIS — G8929 Other chronic pain: Secondary | ICD-10-CM | POA: Insufficient documentation

## 2024-07-10 LAB — GLUCOSE, CAPILLARY
Glucose-Capillary: 100 mg/dL — ABNORMAL HIGH (ref 70–99)
Glucose-Capillary: 133 mg/dL — ABNORMAL HIGH (ref 70–99)
Glucose-Capillary: 98 mg/dL (ref 70–99)
Glucose-Capillary: 88 mg/dL (ref 70–99)

## 2024-07-10 LAB — CBC
HCT: 41 % (ref 36.0–46.0)
Hemoglobin: 13.5 g/dL (ref 12.0–15.0)
MCH: 29.7 pg (ref 26.0–34.0)
MCHC: 32.9 g/dL (ref 30.0–36.0)
MCV: 90.1 fL (ref 80.0–100.0)
Platelets: 305 K/uL (ref 150–400)
RBC: 4.55 MIL/uL (ref 3.87–5.11)
RDW: 15 % (ref 11.5–15.5)
WBC: 15.6 K/uL — ABNORMAL HIGH (ref 4.0–10.5)
nRBC: 0 % (ref 0.0–0.2)

## 2024-07-10 LAB — HEMOGLOBIN A1C
Hgb A1c MFr Bld: 5.4 % (ref 4.8–5.6)
Mean Plasma Glucose: 108.28 mg/dL

## 2024-07-10 LAB — BASIC METABOLIC PANEL WITH GFR
Anion gap: 13 (ref 5–15)
BUN: 7 mg/dL (ref 6–20)
CO2: 25 mmol/L (ref 22–32)
Calcium: 9.2 mg/dL (ref 8.9–10.3)
Chloride: 101 mmol/L (ref 98–111)
Creatinine, Ser: 0.53 mg/dL (ref 0.44–1.00)
GFR, Estimated: >60 mL/min
Glucose, Bld: 100 mg/dL — ABNORMAL HIGH (ref 70–99)
Potassium: 3.9 mmol/L (ref 3.5–5.1)
Sodium: 139 mmol/L (ref 135–145)

## 2024-07-10 MED ORDER — ENOXAPARIN SODIUM 60 MG/0.6ML IJ SOSY
50.0000 mg | PREFILLED_SYRINGE | INTRAMUSCULAR | Status: DC
  Administered 2024-07-10 – 2024-07-13 (×4): 50 mg via SUBCUTANEOUS
  Filled 2024-07-10 (×4): qty 0.6

## 2024-07-10 MED ORDER — HYDROMORPHONE HCL 1 MG/ML IJ SOLN
1.0000 mg | INTRAMUSCULAR | Status: DC | PRN
  Administered 2024-07-10 – 2024-07-12 (×13): 1 mg via INTRAVENOUS
  Filled 2024-07-10 (×13): qty 1

## 2024-07-10 NOTE — Plan of Care (Signed)
  Problem: Metabolic: Goal: Ability to maintain appropriate glucose levels will improve Outcome: Progressing   Problem: Education: Goal: Knowledge of General Education information will improve Description: Including pain rating scale, medication(s)/side effects and non-pharmacologic comfort measures Outcome: Progressing

## 2024-07-10 NOTE — TOC CM/SW Note (Signed)
 Transition of Care [facility]) CM/SW Note    Transition of Care [facility]) - Inpatient Brief Assessment   Patient Details  Name: Stacey Pope MRN: [id number] Date of Birth: 17-Aug-1985  Transition of Care [facility]) CM/SW Contact:    Alfonso Rummer, LCSW Phone Number: 07/10/2024, 1:30 PM   Clinical Narrative:  Completed toc chart review. Substance use resources added to AVS. TOC does not engage in sub use counseling. Please contact should other toc needs arise.   Transition of Care Asessment: Insurance and Status: Selfpay Patient has primary care physician: Yes (TESTER, HILLARY A) Home environment has been reviewed: single family home   Prior/Current Home Services: No current home services Social Drivers of Health Review: SDOH reviewed no interventions necessary Readmission risk has been reviewed: No Transition of care needs: no transition of care needs at this time

## 2024-07-10 NOTE — Progress Notes (Signed)
 " Progress Note   Patient: Stacey Pope FMW:[id number] DOB: 01-24-1986 DOA: 07/09/2024     0 DOS: the patient was seen and examined on 07/10/2024   Brief hospital course: From HPI Stacey Pope is a 39 y.o. female with medical history significant for Diabetes, chronic back pain on opiates being admitted with acute pancreatitis of uncertain etiology.  She presented with a 1 day history of abdominal pain nausea and vomiting.  She denies fever, dysuria, change in bowel habits, chest pain or shortness of breath.  Denies alcohol use. In the ED, hypertensive with otherwise normal vitals Labs notable for lipase of 1188 with normal LFTs.  WBC 16,000.  UDS positive for multiple agents (opiates, cocaine, benzodiazepines, amphetamines and tetrahydrocannabinol's).  Urinalysis with positive nitrites but patient denies dysuria. Triglyceride level pending-->180 CT abdomen and pelvis showed acute pancreatitis and hepatomegaly right upper quadrant ultrasound showed no acute findings. Patient treated with Toradol , hydromorphone x 2, Zofran  and given fluid boluses but continued to be in pain. Urine culture sent.  Antibiotics not started Admission requested.     Assessment and Plan:   Acute pancreatitis Presents with abdominal pain nausea and vomiting, lipase 1188 and CT showing pancreatitis.  Normal LFTs and right upper quadrant with no acute findings Pain meds, IV antiemetics and IV Protonix  Clear liquid diet Continue IV fluid Patient denies any alcohol use   Abnormal urinalysis UA with nitrites, WBC 15,000 Follow-up urine culture Holding off antibiotics for now   Polysubstance abuse (HCC) Multiple substances noted on UDS(opiates, cocaine, benzodiazepines, amphetamines and tetrahydrocannabinol's) Patient adamantly denies cocaine use or other illicit drug use She is on chronic opiates   Diabetes mellitus without complication (HCC) Sliding scale insulin  coverage   HTN (hypertension) Continue  home Cozaar  Hydralazine  IV for additional BP control   Chronic back pain On chronic opiates   Moderate persistent asthma without complication Not acutely exacerbated Albuterol  as needed Continue home inhalers   Migraine No acute issues   DVT prophylaxis: Lovenox    Consults: none   Advance Care Planning:   Code Status: Prior    Family Communication: none   Disposition Plan: Back to previous home environment    Subjective:  Patient seen and examined at bedside this morning Still has epigastric pain Denies nausea or vomiting  Physical Exam:  Vitals and nursing note reviewed.  Constitutional:      General: She is not in acute distress. HENT:     Head: Normocephalic and atraumatic.  Cardiovascular:     Rate and Rhythm: Normal rate and regular rhythm.     Heart sounds: Normal heart sounds.  Pulmonary:     Effort: Pulmonary effort is normal.     Breath sounds: Normal breath sounds.  Abdominal:     Palpations: Abdomen is soft.     Tenderness: There is abdominal tenderness in the epigastric area.  Neurological:     Mental Status: Mental status is at baseline.   Data Reviewed: Abdominal CT scan showing findings of acute pancreatitis    Latest Ref Rng & Units 07/10/2024    4:28 AM 07/09/2024   12:43 PM 12/30/2023    5:03 AM  CBC  WBC 4.0 - 10.5 K/uL 15.6  15.9  15.9   Hemoglobin 12.0 - 15.0 g/dL 86.4  85.5  86.1   Hematocrit 36.0 - 46.0 % 41.0  43.1  41.4   Platelets 150 - 400 K/uL 305  334  341        Latest Ref Rng &  Units 07/10/2024    4:28 AM 07/09/2024   12:43 PM 12/30/2023    5:03 AM  BMP  Glucose 70 - 99 mg/dL 899  847  876   BUN 6 - 20 mg/dL 7  9  23    Creatinine 0.44 - 1.00 mg/dL 9.46  9.42  9.44   Sodium 135 - 145 mmol/L 139  139  134   Potassium 3.5 - 5.1 mmol/L 3.9  3.4  4.0   Chloride 98 - 111 mmol/L 101  102  103   CO2 22 - 32 mmol/L 25  21  23    Calcium 8.9 - 10.3 mg/dL 9.2  9.2  9.5      Vitals:   07/09/24 2254 07/09/24 2351 07/10/24 0329  07/10/24 0907  BP:  (!) 153/95 (!) 148/74 (!) 150/89  Pulse:  82 80 78  Resp:   16 18  Temp:  98.2 F (36.8 C) 98 F (36.7 C) 98.1 F (36.7 C)  TempSrc:  Oral Oral   SpO2: 100% 100% 98% 95%  Weight:      Height:         Author: Drue ONEIDA Potter, MD 07/10/2024 1:37 PM  For on call review [email or web address]  "

## 2024-07-10 NOTE — Discharge Instructions (Signed)

## 2024-07-10 NOTE — Assessment & Plan Note (Signed)
 On chronic opiates

## 2024-07-11 LAB — GLUCOSE, CAPILLARY
Glucose-Capillary: 96 mg/dL (ref 70–99)
Glucose-Capillary: 78 mg/dL (ref 70–99)
Glucose-Capillary: 86 mg/dL (ref 70–99)
Glucose-Capillary: 104 mg/dL — ABNORMAL HIGH (ref 70–99)

## 2024-07-11 MED ORDER — SODIUM CHLORIDE 0.9 % IV SOLN: INTRAVENOUS | Status: DC

## 2024-07-11 NOTE — Progress Notes (Signed)
 " Progress Note   Patient: Stacey Pope FMW:[id number] DOB: 03-02-1986 DOA: 07/09/2024     1 DOS: the patient was seen and examined on 07/11/2024     Brief hospital course: From HPI JAMYRIA OZANICH is a 39 y.o. female with medical history significant for Diabetes, chronic back pain on opiates being admitted with acute pancreatitis of uncertain etiology.  She presented with a 1 day history of abdominal pain nausea and vomiting.  She denies fever, dysuria, change in bowel habits, chest pain or shortness of breath.  Denies alcohol use. In the ED, hypertensive with otherwise normal vitals Labs notable for lipase of 1188 with normal LFTs.  WBC 16,000.  UDS positive for multiple agents (opiates, cocaine, benzodiazepines, amphetamines and tetrahydrocannabinol's).  Urinalysis with positive nitrites but patient denies dysuria. Triglyceride level pending-->180 CT abdomen and pelvis showed acute pancreatitis and hepatomegaly right upper quadrant ultrasound showed no acute findings. Patient treated with Toradol , hydromorphone x 2, Zofran  and given fluid boluses but continued to be in pain. Urine culture sent.  Antibiotics not started Admission requested.       Assessment and Plan:    Acute pancreatitis Presents with abdominal pain nausea and vomiting, lipase 1188 and CT showing pancreatitis.  Normal LFTs and right upper quadrant with no acute findings Pain meds, IV antiemetics and IV Protonix  Diet has been advanced from clear liquid to full liquid IV fluid increased Patient denies any alcohol use   Abnormal urinalysis UA with nitrites, WBC 15,000 Follow-up urine culture Holding off antibiotics for now   Polysubstance abuse (HCC) Multiple substances noted on UDS(opiates, cocaine, benzodiazepines, amphetamines and tetrahydrocannabinol's) Patient adamantly denies cocaine use or other illicit drug use She is on chronic opiates   Diabetes mellitus without complication (HCC) Sliding scale  insulin  coverage   HTN (hypertension) Continue home Cozaar  Hydralazine  IV for additional BP control   Chronic back pain On chronic opiates   Moderate persistent asthma without complication Not acutely exacerbated Albuterol  as needed Continue home inhalers   Migraine No acute issues   DVT prophylaxis: Lovenox    Consults: none   Advance Care Planning:   Code Status: Prior    Family Communication: none   Disposition Plan: Back to previous home environment     Subjective:  Patient seen and examined at bedside this morning She tells me her abdominal pain is unchanged IV fluid increased  Physical Exam:   Vitals and nursing note reviewed.  Constitutional:      General: She is not in acute distress. HENT:     Head: Normocephalic and atraumatic.  Cardiovascular:     Rate and Rhythm: Normal rate and regular rhythm.     Heart sounds: Normal heart sounds.  Pulmonary:     Effort: Pulmonary effort is normal.     Breath sounds: Normal breath sounds.  Abdominal: Epigastric tenderness noted Neurological:     Mental Status: Mental status is at baseline.    Data Reviewed:   Vitals:   07/10/24 1702 07/10/24 2033 07/11/24 0412 07/11/24 0812  BP: (!) 152/72 (!) 135/97 (!) 145/78 128/67  Pulse: 69 74 76 71  Resp: 18 16 19 15   Temp: 97.9 F (36.6 C) 98.5 F (36.9 C) (!) 97.3 F (36.3 C) 97.7 F (36.5 C)  TempSrc:  Oral Oral   SpO2: 99% 100% 99% 95%  Weight:      Height:          Latest Ref Rng & Units 07/10/2024    4:28 AM  07/09/2024   12:43 PM 12/30/2023    5:03 AM  CBC  WBC 4.0 - 10.5 K/uL 15.6  15.9  15.9   Hemoglobin 12.0 - 15.0 g/dL 86.4  85.5  86.1   Hematocrit 36.0 - 46.0 % 41.0  43.1  41.4   Platelets 150 - 400 K/uL 305  334  341        Latest Ref Rng & Units 07/10/2024    4:28 AM 07/09/2024   12:43 PM 12/30/2023    5:03 AM  BMP  Glucose 70 - 99 mg/dL 899  847  876   BUN 6 - 20 mg/dL 7  9  23    Creatinine 0.44 - 1.00 mg/dL 9.46  9.42  9.44   Sodium  135 - 145 mmol/L 139  139  134   Potassium 3.5 - 5.1 mmol/L 3.9  3.4  4.0   Chloride 98 - 111 mmol/L 101  102  103   CO2 22 - 32 mmol/L 25  21  23    Calcium 8.9 - 10.3 mg/dL 9.2  9.2  9.5      Author: Drue ONEIDA Potter, MD 07/11/2024 1:11 PM  For on call review [email or web address]  "

## 2024-07-12 ENCOUNTER — Inpatient Hospital Stay: Payer: Self-pay

## 2024-07-12 LAB — CBC WITH DIFFERENTIAL/PLATELET
Abs Immature Granulocytes: 0.02 K/uL (ref 0.00–0.07)
Basophils Absolute: 0.1 K/uL (ref 0.0–0.1)
Basophils Relative: 1 %
Eosinophils Absolute: 0.3 K/uL (ref 0.0–0.5)
Eosinophils Relative: 3 %
HCT: 34.3 % — ABNORMAL LOW (ref 36.0–46.0)
Hemoglobin: 11.1 g/dL — ABNORMAL LOW (ref 12.0–15.0)
Immature Granulocytes: 0 %
Lymphocytes Relative: 37 %
Lymphs Abs: 3.2 K/uL (ref 0.7–4.0)
MCH: 29.6 pg (ref 26.0–34.0)
MCHC: 32.4 g/dL (ref 30.0–36.0)
MCV: 91.5 fL (ref 80.0–100.0)
Monocytes Absolute: 0.6 K/uL (ref 0.1–1.0)
Monocytes Relative: 6 %
Neutro Abs: 4.7 K/uL (ref 1.7–7.7)
Neutrophils Relative %: 53 %
Platelets: 254 K/uL (ref 150–400)
RBC: 3.75 MIL/uL — ABNORMAL LOW (ref 3.87–5.11)
RDW: 14.9 % (ref 11.5–15.5)
WBC: 8.8 K/uL (ref 4.0–10.5)
nRBC: 0 % (ref 0.0–0.2)

## 2024-07-12 LAB — GLUCOSE, CAPILLARY
Glucose-Capillary: 99 mg/dL (ref 70–99)
Glucose-Capillary: 152 mg/dL — ABNORMAL HIGH (ref 70–99)
Glucose-Capillary: 94 mg/dL (ref 70–99)
Glucose-Capillary: 103 mg/dL — ABNORMAL HIGH (ref 70–99)

## 2024-07-12 LAB — URINE CULTURE: Culture: >=100000 — AB

## 2024-07-12 LAB — BASIC METABOLIC PANEL WITH GFR
Anion gap: 8 (ref 5–15)
BUN: <5 mg/dL — ABNORMAL LOW (ref 6–20)
CO2: 25 mmol/L (ref 22–32)
Calcium: 8.6 mg/dL — ABNORMAL LOW (ref 8.9–10.3)
Chloride: 107 mmol/L (ref 98–111)
Creatinine, Ser: 0.5 mg/dL (ref 0.44–1.00)
GFR, Estimated: >60 mL/min
Glucose, Bld: 100 mg/dL — ABNORMAL HIGH (ref 70–99)
Potassium: 3 mmol/L — ABNORMAL LOW (ref 3.5–5.1)
Sodium: 141 mmol/L (ref 135–145)

## 2024-07-12 MED ORDER — IOHEXOL 300 MG/ML  SOLN
100.0000 mL | Freq: Once | INTRAMUSCULAR | Status: AC | PRN
  Administered 2024-07-12: 100 mL via INTRAVENOUS

## 2024-07-12 MED ORDER — PANTOPRAZOLE SODIUM 40 MG PO TBEC
40.0000 mg | DELAYED_RELEASE_TABLET | Freq: Every day | ORAL | Status: DC
  Administered 2024-07-12 – 2024-07-13 (×2): 40 mg via ORAL
  Filled 2024-07-12 (×2): qty 1

## 2024-07-12 MED ORDER — SODIUM CHLORIDE 0.9 % IV SOLN: INTRAVENOUS | Status: DC

## 2024-07-12 MED ORDER — HYDROMORPHONE HCL 1 MG/ML IJ SOLN
1.0000 mg | INTRAMUSCULAR | Status: DC | PRN
  Administered 2024-07-12 – 2024-07-14 (×7): 1 mg via INTRAVENOUS
  Filled 2024-07-12 (×7): qty 1

## 2024-07-12 MED ORDER — POTASSIUM CHLORIDE CRYS ER 20 MEQ PO TBCR
40.0000 meq | EXTENDED_RELEASE_TABLET | Freq: Once | ORAL | Status: AC
  Administered 2024-07-12: 40 meq via ORAL
  Filled 2024-07-12: qty 2

## 2024-07-12 NOTE — Plan of Care (Signed)
   Problem: Education: Goal: Knowledge of General Education information will improve Description Including pain rating scale, medication(s)/side effects and non-pharmacologic comfort measures Outcome: Progressing

## 2024-07-12 NOTE — Progress Notes (Signed)
 " Progress Note   Patient: Stacey Pope FMW:[id number] DOB: [birth year] DOA: 07/09/2024     2 DOS: the patient was seen and examined on 07/12/2024     Brief hospital course: From HPI Stacey Pope is a 39 y.o. female with medical history significant for Diabetes, chronic back pain on opiates being admitted with acute pancreatitis of uncertain etiology.  She presented with a 1 day history of abdominal pain nausea and vomiting.  She denies fever, dysuria, change in bowel habits, chest pain or shortness of breath.  Denies alcohol use. In the ED, hypertensive with otherwise normal vitals Labs notable for lipase of 1188 with normal LFTs.  WBC 16,000.  UDS positive for multiple agents (opiates, cocaine, benzodiazepines, amphetamines and tetrahydrocannabinol's).  Urinalysis with positive nitrites but patient denies dysuria. Triglyceride level pending-->180 CT abdomen and pelvis showed acute pancreatitis and hepatomegaly right upper quadrant ultrasound showed no acute findings. Patient treated with Toradol , hydromorphone  x 2, Zofran  and given fluid boluses but continued to be in pain. Urine culture sent.  Antibiotics not started Admission requested.       Assessment and Plan:    Acute pancreatitis Presents with abdominal pain nausea and vomiting, lipase 1188 and CT showing pancreatitis.  Normal LFTs and right upper quadrant with no acute findings Pain meds, IV antiemetics and IV Protonix  Continue clear liquid diet Frequency of Dilaudid  decreased Continue current IV fluid Patient denies any alcohol use   Abnormal urinalysis UA with nitrites, WBC 15,000 Follow-up urine culture Holding off antibiotics for now   Polysubstance abuse (HCC) Multiple substances noted on UDS(opiates, cocaine, benzodiazepines, amphetamines and tetrahydrocannabinol's) Patient adamantly denies cocaine use or other illicit drug use She is on chronic opiates   Diabetes mellitus without complication (HCC) Sliding  scale insulin  coverage   HTN (hypertension) Continue home Cozaar  Hydralazine  IV for additional BP control   Chronic back pain On chronic opiates   Moderate persistent asthma without complication Not acutely exacerbated Albuterol  as needed Continue home inhalers   Migraine No acute issues   DVT prophylaxis: Lovenox    Consults: none   Advance Care Planning:   Code Status: Prior    Family Communication: none   Disposition Plan: Back to previous home environment     Subjective:  Patient seen and examined at bedside this morning Still has some epigastric pain but improving Had some nausea vomiting Diet has been changed to full liquid to clear liquid   Physical Exam:   Vitals and nursing note reviewed.  Constitutional:      General: She is not in acute distress. HENT:     Head: Normocephalic and atraumatic.  Cardiovascular:     Rate and Rhythm: Normal rate and regular rhythm.     Heart sounds: Normal heart sounds.  Pulmonary:     Effort: Pulmonary effort is normal.     Breath sounds: Normal breath sounds.  Abdominal: Epigastric tenderness noted Neurological:     Mental Status: Mental status is at baseline.    Data Reviewed:      Latest Ref Rng & Units 07/12/2024    4:13 AM 07/10/2024    4:28 AM 07/09/2024   12:43 PM  CBC  WBC 4.0 - 10.5 K/uL 8.8  15.6  15.9   Hemoglobin 12.0 - 15.0 g/dL 88.8  86.4  85.5   Hematocrit 36.0 - 46.0 % 34.3  41.0  43.1   Platelets 150 - 400 K/uL 254  305  334  Latest Ref Rng & Units 07/12/2024    4:13 AM 07/10/2024    4:28 AM 07/09/2024   12:43 PM  BMP  Glucose 70 - 99 mg/dL 899  899  847   BUN 6 - 20 mg/dL 5  7  9    Creatinine 0.44 - 1.00 mg/dL 9.49  9.46  9.42   Sodium 135 - 145 mmol/L 141  139  139   Potassium 3.5 - 5.1 mmol/L 3.0  3.9  3.4   Chloride 98 - 111 mmol/L 107  101  102   CO2 22 - 32 mmol/L 25  25  21    Calcium 8.9 - 10.3 mg/dL 8.6  9.2  9.2       Vitals:   07/11/24 1515 07/11/24 2137 07/12/24 0430  07/12/24 0749  BP: (!) 147/85 135/87 128/73 114/64  Pulse: 76 70 69 62  Resp: 16 16 19 16   Temp: 97.7 F (36.5 C) 98.2 F (36.8 C) 98.2 F (36.8 C) 97.9 F (36.6 C)  TempSrc:  Oral Oral Oral  SpO2: 96% 100% 99% 97%  Weight:      Height:         Author: Drue ONEIDA Potter, MD 07/12/2024 2:43 PM  For on call review [email or web address]  "

## 2024-07-12 NOTE — Progress Notes (Signed)
 Pt states she has not been able to tolerate full liquid diet, stating it is making her nauseous.  Diet changed back to clear liquid at this time.  Dorinda, MD, aware of same.

## 2024-07-13 LAB — BASIC METABOLIC PANEL WITH GFR
Anion gap: 9 (ref 5–15)
BUN: <5 mg/dL — ABNORMAL LOW (ref 6–20)
CO2: 24 mmol/L (ref 22–32)
Calcium: 8.9 mg/dL (ref 8.9–10.3)
Chloride: 109 mmol/L (ref 98–111)
Creatinine, Ser: 0.5 mg/dL (ref 0.44–1.00)
GFR, Estimated: >60 mL/min
Glucose, Bld: 99 mg/dL (ref 70–99)
Potassium: 3.1 mmol/L — ABNORMAL LOW (ref 3.5–5.1)
Sodium: 141 mmol/L (ref 135–145)

## 2024-07-13 LAB — GLUCOSE, CAPILLARY
Glucose-Capillary: 169 mg/dL — ABNORMAL HIGH (ref 70–99)
Glucose-Capillary: 132 mg/dL — ABNORMAL HIGH (ref 70–99)
Glucose-Capillary: 118 mg/dL — ABNORMAL HIGH (ref 70–99)
Glucose-Capillary: 120 mg/dL — ABNORMAL HIGH (ref 70–99)

## 2024-07-13 MED ORDER — POTASSIUM CHLORIDE CRYS ER 20 MEQ PO TBCR
40.0000 meq | EXTENDED_RELEASE_TABLET | Freq: Two times a day (BID) | ORAL | Status: AC
  Administered 2024-07-13 (×2): 40 meq via ORAL
  Filled 2024-07-13 (×2): qty 2

## 2024-07-13 MED ORDER — OXYCODONE HCL 5 MG PO TABS
10.0000 mg | ORAL_TABLET | Freq: Four times a day (QID) | ORAL | Status: DC | PRN
  Administered 2024-07-13 – 2024-07-14 (×3): 10 mg via ORAL
  Filled 2024-07-13 (×4): qty 2

## 2024-07-13 NOTE — Progress Notes (Signed)
 " Progress Note   Patient: Stacey Pope FMW:[id number] DOB: [birth year] DOA: 07/09/2024     3 DOS: the patient was seen and examined on 07/13/2024     Brief hospital course: From HPI TODD ARGABRIGHT is a 39 y.o. female with medical history significant for Diabetes, chronic back pain on opiates being admitted with acute pancreatitis of uncertain etiology.  She presented with a 1 day history of abdominal pain nausea and vomiting.  She denies fever, dysuria, change in bowel habits, chest pain or shortness of breath.  Denies alcohol use. In the ED, hypertensive with otherwise normal vitals Labs notable for lipase of 1188 with normal LFTs.  WBC 16,000.  UDS positive for multiple agents (opiates, cocaine, benzodiazepines, amphetamines and tetrahydrocannabinol's).  Urinalysis with positive nitrites but patient denies dysuria. Triglyceride level pending-->180 CT abdomen and pelvis showed acute pancreatitis and hepatomegaly right upper quadrant ultrasound showed no acute findings. Patient treated with Toradol , hydromorphone  x 2, Zofran  and given fluid boluses but continued to be in pain. Urine culture sent.  Antibiotics not started Admission requested.       Assessment and Plan:    Acute pancreatitis Presents with abdominal pain nausea and vomiting, lipase 1188 and CT showing pancreatitis.  Normal LFTs and right upper quadrant with no acute findings Pain meds, IV antiemetics and IV Protonix  Diet advanced to full liquid today Frequency of Dilaudid  decreased Continue current IV fluid Patient denies any alcohol use   Abnormal urinalysis UA with nitrites, WBC 15,000 Follow-up urine culture Holding off antibiotics for now   Polysubstance abuse (HCC) Multiple substances noted on UDS(opiates, cocaine, benzodiazepines, amphetamines and tetrahydrocannabinol's) Patient adamantly denies cocaine use or other illicit drug use She is on chronic opiates   Diabetes mellitus without complication  (HCC) Sliding scale insulin  coverage   HTN (hypertension) Continue home Cozaar  Hydralazine  IV for additional BP control   Chronic back pain On chronic opiates   Moderate persistent asthma without complication Not acutely exacerbated Albuterol  as needed Continue home inhalers   Migraine No acute issues   DVT prophylaxis: Lovenox    Consults: none   Advance Care Planning:   Code Status: Prior    Family Communication: none   Disposition Plan: Back to previous home environment     Subjective:  Patient seen and examined at bedside this morning Symptoms improving Diet being advanced   Physical Exam:   Vitals and nursing note reviewed.  Constitutional:      General: She is not in acute distress. HENT:     Head: Normocephalic and atraumatic.  Cardiovascular:     Rate and Rhythm: Normal rate and regular rhythm.     Heart sounds: Normal heart sounds.  Pulmonary:     Effort: Pulmonary effort is normal.     Breath sounds: Normal breath sounds.  Abdominal: Epigastric tenderness noted Neurological:     Mental Status: Mental status is at baseline.    Data Reviewed:     Latest Ref Rng & Units 07/13/2024    5:25 AM 07/12/2024    4:13 AM 07/10/2024    4:28 AM  BMP  Glucose 70 - 99 mg/dL 99  899  899   BUN 6 - 20 mg/dL <5  <5  7   Creatinine 0.44 - 1.00 mg/dL 9.49  9.49  9.46   Sodium 135 - 145 mmol/L 141  141  139   Potassium 3.5 - 5.1 mmol/L 3.1  3.0  3.9   Chloride 98 - 111 mmol/L 109  107  101   CO2 22 - 32 mmol/L 24  25  25    Calcium 8.9 - 10.3 mg/dL 8.9  8.6  9.2     Vitals:   07/12/24 0749 07/12/24 1639 07/12/24 2106 07/13/24 0502  BP: 114/64 (!) 155/96 130/73 123/67  Pulse: 62 73 64 69  Resp: 16 16 20 20   Temp: 97.9 F (36.6 C) 98.1 F (36.7 C) 97.6 F (36.4 C) (!) 97.5 F (36.4 C)  TempSrc: Oral Oral    SpO2: 97% 100% 98% 96%  Weight:      Height:          Latest Ref Rng & Units 07/12/2024    4:13 AM 07/10/2024    4:28 AM 07/09/2024   12:43 PM  CBC   WBC 4.0 - 10.5 K/uL 8.8  15.6  15.9   Hemoglobin 12.0 - 15.0 g/dL 88.8  86.4  85.5   Hematocrit 36.0 - 46.0 % 34.3  41.0  43.1   Platelets 150 - 400 K/uL 254  305  334      Author: Drue ONEIDA Potter, MD 07/13/2024 3:50 PM  For on call review [email or web address]  "

## 2024-07-13 NOTE — Plan of Care (Signed)

## 2024-07-14 LAB — BASIC METABOLIC PANEL WITH GFR
Anion gap: 9 (ref 5–15)
BUN: <5 mg/dL — ABNORMAL LOW (ref 6–20)
CO2: 24 mmol/L (ref 22–32)
Calcium: 9 mg/dL (ref 8.9–10.3)
Chloride: 108 mmol/L (ref 98–111)
Creatinine, Ser: 0.49 mg/dL (ref 0.44–1.00)
GFR, Estimated: >60 mL/min
Glucose, Bld: 92 mg/dL (ref 70–99)
Potassium: 3.7 mmol/L (ref 3.5–5.1)
Sodium: 141 mmol/L (ref 135–145)

## 2024-07-14 LAB — GLUCOSE, CAPILLARY: Glucose-Capillary: 108 mg/dL — ABNORMAL HIGH (ref 70–99)

## 2024-07-14 NOTE — Discharge Summary (Signed)
 " Physician Discharge Summary   Patient: Stacey Pope MRN: [id number] DOB: [birth year]  Admit date:     07/09/2024  Discharge date: 07/14/2024  Discharge Physician: Drue ONEIDA Potter   PCP: Tester, Rolland LABOR, PA-C   Recommendations at discharge:  Follow-up with PCP  Discharge Diagnoses: Principal Problem:   Acute pancreatitis Active Problems:   Abnormal urinalysis   Polysubstance abuse (HCC)   HTN (hypertension)   Diabetes mellitus without complication (HCC)   Migraine   Moderate persistent asthma without complication   Chronic back pain  Resolved Problems:   * No resolved hospital problems. [facility] Course: From HPI Stacey Pope is a 39 y.o. female with medical history significant for Diabetes, chronic back pain on opiates being admitted with acute pancreatitis of uncertain etiology.  She presented with a 1 day history of abdominal pain nausea and vomiting.  She denies fever, dysuria, change in bowel habits, chest pain or shortness of breath.  Denies alcohol use. In the ED, hypertensive with otherwise normal vitals Labs notable for lipase of 1188 with normal LFTs.  WBC 16,000.  UDS positive for multiple agents (opiates, cocaine, benzodiazepines, amphetamines and tetrahydrocannabinol's).  Urinalysis with positive nitrites but patient denies dysuria. Triglyceride level pending-->180 CT abdomen and pelvis showed acute pancreatitis and hepatomegaly right upper quadrant ultrasound showed no acute findings. Patient treated with Toradol , hydromorphone  x 2, Zofran  and given fluid boluses but continued to be in pain. Urine culture sent.  Antibiotics not started Admission requested.       Assessment and Plan:    Acute pancreatitis Presents with abdominal pain nausea and vomiting, lipase 1188 and CT showing pancreatitis.  Normal LFTs and right upper quadrant with no acute findings Patient improved on IV fluid as well as pain medicine. Now cleared for discharge today to follow-up  with Dr. Jama care physician  Abnormal urinalysis UA with nitrites, WBC 15,000 Follow-up urine culture Holding off antibiotics for now   Polysubstance abuse (HCC) Multiple substances noted on UDS(opiates, cocaine, benzodiazepines, amphetamines and tetrahydrocannabinol's) Patient adamantly denies cocaine use or other illicit drug use She is on chronic opiates   Diabetes mellitus without complication (HCC) Sliding scale insulin  coverage   HTN (hypertension) Continue home Cozaar     Chronic back pain On chronic opiates   Moderate persistent asthma without complication Not acutely exacerbated Albuterol  as needed Continue home inhalers   Migraine No acute issues    Consultants: None Procedures performed: None Disposition: Home Diet recommendation:  Cardiac diet DISCHARGE MEDICATION: Allergies as of 07/14/2024       Reactions   Amoxicillin Rash   Augmentin [amoxicillin-pot Clavulanate] Anaphylaxis   Clavulanic Acid         Medication List     STOP taking these medications    predniSONE  10 MG tablet Commonly known as: DELTASONE        TAKE these medications    chlorpheniramine-HYDROcodone  10-8 MG/5ML Commonly known as: TUSSIONEX Take 5 mLs by mouth every 12 (twelve) hours as needed for cough.   clonazePAM  1 MG tablet Commonly known as: KLONOPIN  Take 1 mg by mouth 2 (two) times daily as needed for anxiety.   FLUoxetine  10 MG capsule Commonly known as: PROZAC  Take 20 mg by mouth daily.   hydrOXYzine  25 MG tablet Commonly known as: ATARAX  Take 25 mg by mouth 3 (three) times daily as needed.   ipratropium-albuterol  0.5-2.5 (3) MG/3ML Soln Commonly known as: DUONEB Take 3 mLs by nebulization every 4 (four) hours as needed.  losartan  25 MG tablet Commonly known as: COZAAR  Take 1 tablet (25 mg total) by mouth daily.   mometasone -formoterol  200-5 MCG/ACT Aero Commonly known as: DULERA Inhale 2 puffs into the lungs 2 (two) times daily.    montelukast  10 MG tablet Commonly known as: SINGULAIR  Take 10 mg by mouth daily.   Mounjaro 7.5 MG/0.5ML Pen Generic drug: tirzepatide Inject 7.5 mg into the skin once a week.   Oxycodone  HCl 10 MG Tabs Take 10 mg by mouth every 8 (eight) hours as needed.   promethazine  25 MG tablet Commonly known as: PHENERGAN  Take 25 mg by mouth every 6 (six) hours as needed for nausea.   tiotropium 18 MCG inhalation capsule Commonly known as: SPIRIVA  Place 1 capsule (18 mcg total) into inhaler and inhale daily.   Vitamin D  (Ergocalciferol ) 1.25 MG (50000 UNIT) Caps capsule Commonly known as: DRISDOL  Take 1 capsule (50,000 Units total) by mouth every 7 (seven) days.        Discharge Exam: Filed Weights   07/09/24 1244  Weight: 104.3 kg   Constitutional:      General: She is not in acute distress. HENT:     Head: Normocephalic and atraumatic.  Cardiovascular:     Rate and Rhythm: Normal rate and regular rhythm.     Heart sounds: Normal heart sounds.  Pulmonary:     Effort: Pulmonary effort is normal.     Breath sounds: Normal breath sounds.  Abdominal: Epigastric tenderness noted Neurological:     Mental Status: Mental status is at baseline.   Condition at discharge: good  The results of significant diagnostics from this hospitalization (including imaging, microbiology, ancillary and laboratory) are listed below for reference.   Imaging Studies: CT ABDOMEN PELVIS W CONTRAST Result Date: 07/12/2024 CLINICAL DATA:  Pancreatitis EXAM: CT ABDOMEN AND PELVIS WITH CONTRAST TECHNIQUE: Multidetector CT imaging of the abdomen and pelvis was performed using the standard protocol following bolus administration of intravenous contrast. RADIATION DOSE REDUCTION: This exam was performed according to the departmental dose-optimization program which includes automated exposure control, adjustment of the mA and/or kV according to patient size and/or use of iterative reconstruction technique.  CONTRAST:  OMNIPAQUE  IOHEXOL  300 MG/ML  SOLN COMPARISON:  CT 07/09/2024, 10/03/2021, MRI 09/05/2022 FINDINGS: Lower chest: Lung bases demonstrate trace pleural effusions and mild dependent atelectasis. Hepatobiliary: No focal liver abnormality is seen. No gallstones, gallbladder wall thickening, or biliary dilatation. Pancreas: Moderate peripancreatic soft tissue stranding and small volume fluid consistent with pancreatitis. Overall decreased inflammatory changes about the pancreas compared to CT several days prior. No organized fluid collections. No signs for necrosis. Spleen: Normal in size without focal abnormality. Adrenals/Urinary Tract: Adrenal glands are unremarkable. Kidneys are normal, without renal calculi, focal lesion, or hydronephrosis. Bladder is unremarkable. Stomach/Bowel: Stomach within normal limits. No dilated small bowel. Negative appendix. No acute bowel wall thickening. Vascular/Lymphatic: Aortic atherosclerosis. No enlarged abdominal or pelvic lymph nodes. Patent portal and splenic veins. Reproductive: Uterus and bilateral adnexa are unremarkable. Other: No free air. Small volume free fluid in the abdomen and pelvis. Musculoskeletal: Irregular degenerative changes at L5-S1 similar compared with CT several days prior, progressive compared with 2023 CT IMPRESSION: 1. Findings consistent with acute pancreatitis. Overall decreased inflammatory changes about the pancreas compared to CT several days prior. No organized fluid collections or signs for necrosis. 2. Small volume free fluid in the abdomen and pelvis. 3. Trace pleural effusions and mild dependent atelectasis at the lung bases. 4. Aortic atherosclerosis. Aortic Atherosclerosis (ICD10-I70.0). Electronically  Signed   By: Luke Bun M.D.   On: 07/12/2024 20:06   CT ABDOMEN PELVIS W CONTRAST Result Date: 07/09/2024 EXAM: CT ABDOMEN AND PELVIS WITH CONTRAST 07/09/2024 04:45:01 PM TECHNIQUE: CT of the abdomen and pelvis was  performed with the administration of 100 mL of iohexol  (OMNIPAQUE ) 300 MG/ML solution. Multiplanar reformatted images are provided for review. Automated exposure control, iterative reconstruction, and/or weight-based adjustment of the mA/kV was utilized to reduce the radiation dose to as low as reasonably achievable. COMPARISON: CT abdomen and pelvis 10/03/2021. CLINICAL HISTORY: elevated lipase. FINDINGS: LOWER CHEST: No acute abnormality. LIVER: The liver is enlarged, measuring up to 22 cm. GALLBLADDER AND BILE DUCTS: Gallbladder is unremarkable. No biliary ductal dilatation. SPLEEN: No acute abnormality. PANCREAS: Diffuse peripancreatic fat stranding and free fluid. The pancreatic parenchyma enhances. No pancreatic pseudocysts formation. ADRENAL GLANDS: No acute abnormality. KIDNEYS, URETERS AND BLADDER: No stones in the kidneys or ureters. No hydronephrosis. No perinephric or periureteral stranding. Urinary bladder is unremarkable. GI AND BOWEL: Stomach demonstrates no acute abnormality. No small or large bowel thickening or dilatation. The appendix is unremarkable. There is no bowel obstruction. PERITONEUM AND RETROPERITONEUM: No free air. VASCULATURE: Aorta is normal in caliber. LYMPH NODES: No lymphadenopathy. REPRODUCTIVE ORGANS: The uterus is unremarkable. No adnexal mass. BONES AND SOFT TISSUES: L5-S1 intervertebral disc space vacuum phenomenon with endplate sclerosis. No focal soft tissue abnormality. IMPRESSION: 1. Acute pancreatitis. 2. Hepatomegaly. Electronically signed by: Morgane Naveau MD 07/09/2024 05:44 PM EST RP Workstation: [id number]   US  ABDOMEN LIMITED RUQ (LIVER/GB) Result Date: 07/09/2024 EXAM: Right Upper Quadrant Abdominal Ultrasound 07/09/2024 02:53:36 PM TECHNIQUE: Real-time ultrasonography of the right upper quadrant of the abdomen was performed. COMPARISON: US  Abdomen 12/13/2006 CLINICAL HISTORY: Abnormal serum level of lipase. FINDINGS: LIVER: Normal echogenicity. No  intrahepatic biliary ductal dilatation. No evidence of mass. Hepatopetal flow in the portal vein. BILIARY SYSTEM: Gallbladder wall thickness measures 1.6 mm. No pericholecystic fluid. No cholelithiasis. The common bile duct measures 5.6 mm. OTHER: No right upper quadrant ascites. IMPRESSION: 1. No acute right upper quadrant ultrasound findings. Electronically signed by: Vanetta Chou MD 07/09/2024 03:11 PM EST RP Workstation: [id number]    Microbiology: Results for orders placed or performed during the hospital encounter of 07/09/24  Urine Culture     Status: Abnormal   Collection Time: 07/09/24  3:51 PM   Specimen: Urine, Clean Catch  Result Value Ref Range Status   Specimen Description   Final    URINE, CLEAN CATCH Performed at [facility], [address] [place], [place] [address]    Special Requests   Final    NONE Performed at [facility], [address], [place], [place] [address]    Culture >=100,000 COLONIES/mL KLEBSIELLA PNEUMONIAE (A)  Final   Report Status 07/12/2024 FINAL  Final   Organism ID, Bacteria KLEBSIELLA PNEUMONIAE (A)  Final      Susceptibility   Klebsiella pneumoniae - MIC*    AMPICILLIN RESISTANT Resistant     CEFAZOLIN (URINE) Value in next row Sensitive      <=1 SENSITIVEThis is a modified FDA-approved test that has been validated and its performance characteristics determined by the reporting laboratory.  This laboratory is certified under the Clinical Laboratory Improvement Amendments CLIA as qualified to perform high complexity clinical laboratory testing.    CEFEPIME Value in next row Sensitive      <=1 SENSITIVEThis is a modified FDA-approved test that has been validated and its performance characteristics determined by the reporting laboratory.  This laboratory is certified under the Clinical Laboratory Improvement Amendments CLIA as qualified to perform high complexity clinical laboratory testing.    ERTAPENEM Value in next row  Sensitive      <=1 SENSITIVEThis is a modified FDA-approved test that has been validated and its performance characteristics determined by the reporting laboratory.  This laboratory is certified under the Clinical Laboratory Improvement Amendments CLIA as qualified to perform high complexity clinical laboratory testing.    CEFTRIAXONE Value in next row Sensitive      <=1 SENSITIVEThis is a modified FDA-approved test that has been validated and its performance characteristics determined by the reporting laboratory.  This laboratory is certified under the Clinical Laboratory Improvement Amendments CLIA as qualified to perform high complexity clinical laboratory testing.    CIPROFLOXACIN Value in next row Sensitive      <=1 SENSITIVEThis is a modified FDA-approved test that has been validated and its performance characteristics determined by the reporting laboratory.  This laboratory is certified under the Clinical Laboratory Improvement Amendments CLIA as qualified to perform high complexity clinical laboratory testing.    GENTAMICIN  Value in next row Sensitive      <=1 SENSITIVEThis is a modified FDA-approved test that has been validated and its performance characteristics determined by the reporting laboratory.  This laboratory is certified under the Clinical Laboratory Improvement Amendments CLIA as qualified to perform high complexity clinical laboratory testing.    NITROFURANTOIN Value in next row Sensitive      <=1 SENSITIVEThis is a modified FDA-approved test that has been validated and its performance characteristics determined by the reporting laboratory.  This laboratory is certified under the Clinical Laboratory Improvement Amendments CLIA as qualified to perform high complexity clinical laboratory testing.    TRIMETH /SULFA  Value in next row Sensitive      <=1 SENSITIVEThis is a modified FDA-approved test that has been validated and its performance characteristics determined by the reporting  laboratory.  This laboratory is certified under the Clinical Laboratory Improvement Amendments CLIA as qualified to perform high complexity clinical laboratory testing.    AMPICILLIN/SULBACTAM Value in next row Sensitive      <=1 SENSITIVEThis is a modified FDA-approved test that has been validated and its performance characteristics determined by the reporting laboratory.  This laboratory is certified under the Clinical Laboratory Improvement Amendments CLIA as qualified to perform high complexity clinical laboratory testing.    PIP/TAZO Value in next row Sensitive      <=4 SENSITIVEThis is a modified FDA-approved test that has been validated and its performance characteristics determined by the reporting laboratory.  This laboratory is certified under the Clinical Laboratory Improvement Amendments CLIA as qualified to perform high complexity clinical laboratory testing.    MEROPENEM Value in next row Sensitive      <=4 SENSITIVEThis is a modified FDA-approved test that has been validated and its performance characteristics determined by the reporting laboratory.  This laboratory is certified under the Clinical Laboratory Improvement Amendments CLIA as qualified to perform high complexity clinical laboratory testing.    * >=100,000 COLONIES/mL KLEBSIELLA PNEUMONIAE    Labs: CBC: Recent Labs  Lab 07/09/24 1243 07/10/24 0428 07/12/24 0413  WBC 15.9* 15.6* 8.8  NEUTROABS  --   --  4.7  HGB 14.4 13.5 11.1*  HCT 43.1 41.0 34.3*  MCV 89.4 90.1 91.5  PLT 334 305 254   Basic Metabolic Panel: Recent Labs  Lab 07/09/24 1243 07/10/24 0428 07/12/24 0413 07/13/24 0525 07/14/24 0451  NA 139 139 141 141 141  K 3.4* 3.9 3.0* 3.1* 3.7  CL 102 101 107 109 108  CO2 21* 25 25 24 24   GLUCOSE 152* 100* 100* 99 92  BUN 9 7 <5* <5* <5*  CREATININE 0.57 0.53 0.50 0.50 0.49  CALCIUM 9.2 9.2 8.6* 8.9 9.0   Liver Function Tests: Recent Labs  Lab 07/09/24 1243  AST 33  ALT 21  ALKPHOS 88  BILITOT  1.0  PROT 7.2  ALBUMIN 4.4   CBG: Recent Labs  Lab 07/13/24 0930 07/13/24 1206 07/13/24 1613 07/13/24 2202 07/14/24 0838  GLUCAP 169* 132* 118* 120* 108*    Discharge time spent:  36 minutes.  Signed: Drue ONEIDA Potter, MD [facility] 07/14/2024 "

## 2024-07-14 NOTE — Progress Notes (Signed)
 Discharge instructions were reviewed with patient and family. IV was removed. Belongings collected by patient.

## 2024-07-14 NOTE — Plan of Care (Signed)
